# Patient Record
Sex: Female | Born: 1985 | Race: White | Hispanic: No | Marital: Married | Smoking: Never smoker
Health system: Southern US, Community
[De-identification: ages and names within clinical notes are randomized; demographics above are authoritative.]

## PROBLEM LIST (undated history)

## (undated) DIAGNOSIS — F419 Anxiety disorder, unspecified: Secondary | ICD-10-CM

## (undated) DIAGNOSIS — E7800 Pure hypercholesterolemia, unspecified: Secondary | ICD-10-CM

## (undated) DIAGNOSIS — R011 Cardiac murmur, unspecified: Secondary | ICD-10-CM

## (undated) DIAGNOSIS — E559 Vitamin D deficiency, unspecified: Secondary | ICD-10-CM

## (undated) DIAGNOSIS — G43909 Migraine, unspecified, not intractable, without status migrainosus: Secondary | ICD-10-CM

## (undated) DIAGNOSIS — N809 Endometriosis, unspecified: Secondary | ICD-10-CM

## (undated) DIAGNOSIS — E282 Polycystic ovarian syndrome: Secondary | ICD-10-CM

## (undated) DIAGNOSIS — R002 Palpitations: Secondary | ICD-10-CM

## (undated) DIAGNOSIS — I341 Nonrheumatic mitral (valve) prolapse: Secondary | ICD-10-CM

## (undated) DIAGNOSIS — Z8742 Personal history of other diseases of the female genital tract: Secondary | ICD-10-CM

## (undated) DIAGNOSIS — U071 COVID-19: Secondary | ICD-10-CM

## (undated) DIAGNOSIS — F50811 Binge eating disorder, moderate: Secondary | ICD-10-CM

## (undated) DIAGNOSIS — Z6833 Body mass index (BMI) 33.0-33.9, adult: Secondary | ICD-10-CM

## (undated) HISTORY — DX: COVID-19: U07.1

## (undated) HISTORY — PX: ANAL FISSURE REPAIR: SHX2312

## (undated) HISTORY — DX: Vitamin D deficiency, unspecified: E55.9

## (undated) HISTORY — DX: Morbid (severe) obesity due to excess calories: E66.01

## (undated) HISTORY — DX: Personal history of other diseases of the female genital tract: Z87.42

## (undated) HISTORY — DX: Body mass index (BMI) 33.0-33.9, adult: Z68.33

## (undated) HISTORY — DX: Anxiety disorder, unspecified: F41.9

## (undated) HISTORY — DX: Palpitations: R00.2

## (undated) HISTORY — DX: Pure hypercholesterolemia, unspecified: E78.00

## (undated) HISTORY — DX: Nonrheumatic mitral (valve) prolapse: I34.1

## (undated) HISTORY — DX: Cardiac murmur, unspecified: R01.1

## (undated) HISTORY — DX: Polycystic ovarian syndrome: E28.2

## (undated) HISTORY — DX: Endometriosis, unspecified: N80.9

## (undated) HISTORY — DX: Binge eating disorder, moderate: F50.811

## (undated) HISTORY — PX: ELBOW SURGERY: SHX618

## (undated) HISTORY — DX: Migraine, unspecified, not intractable, without status migrainosus: G43.909

---
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
## 2006-03-04 ENCOUNTER — Emergency Department (HOSPITAL_COMMUNITY): Admission: EM | Admit: 2006-03-04 | Discharge: 2006-03-04 | Payer: Self-pay | Admitting: Emergency Medicine

## 2006-10-30 ENCOUNTER — Ambulatory Visit: Payer: Self-pay | Admitting: Cardiology

## 2011-04-09 ENCOUNTER — Observation Stay: Payer: Self-pay | Admitting: Obstetrics and Gynecology

## 2011-07-24 ENCOUNTER — Inpatient Hospital Stay: Payer: Self-pay

## 2011-07-24 LAB — CBC WITH DIFFERENTIAL/PLATELET
Basophil #: 0.1 10*3/uL (ref 0.0–0.1)
Eosinophil %: 0.7 %
HGB: 13.6 g/dL (ref 12.0–16.0)
Lymphocyte %: 12.9 %
MCH: 32.7 pg (ref 26.0–34.0)
MCHC: 34.2 g/dL (ref 32.0–36.0)
Neutrophil #: 16.2 10*3/uL — ABNORMAL HIGH (ref 1.4–6.5)
Neutrophil %: 81.2 %
Platelet: 284 10*3/uL (ref 150–440)
RDW: 13.2 % (ref 11.5–14.5)

## 2016-09-27 DIAGNOSIS — R5383 Other fatigue: Secondary | ICD-10-CM | POA: Diagnosis not present

## 2016-09-27 DIAGNOSIS — G43919 Migraine, unspecified, intractable, without status migrainosus: Secondary | ICD-10-CM | POA: Diagnosis not present

## 2017-02-01 DIAGNOSIS — R21 Rash and other nonspecific skin eruption: Secondary | ICD-10-CM | POA: Diagnosis not present

## 2017-04-18 DIAGNOSIS — G43919 Migraine, unspecified, intractable, without status migrainosus: Secondary | ICD-10-CM | POA: Diagnosis not present

## 2017-04-18 DIAGNOSIS — R5383 Other fatigue: Secondary | ICD-10-CM | POA: Diagnosis not present

## 2017-04-25 DIAGNOSIS — Z23 Encounter for immunization: Secondary | ICD-10-CM | POA: Diagnosis not present

## 2017-08-12 DIAGNOSIS — R002 Palpitations: Secondary | ICD-10-CM | POA: Diagnosis not present

## 2017-08-12 DIAGNOSIS — G43919 Migraine, unspecified, intractable, without status migrainosus: Secondary | ICD-10-CM | POA: Diagnosis not present

## 2017-10-08 DIAGNOSIS — J358 Other chronic diseases of tonsils and adenoids: Secondary | ICD-10-CM | POA: Diagnosis not present

## 2017-10-08 DIAGNOSIS — J029 Acute pharyngitis, unspecified: Secondary | ICD-10-CM | POA: Diagnosis not present

## 2017-10-19 DIAGNOSIS — R300 Dysuria: Secondary | ICD-10-CM | POA: Diagnosis not present

## 2017-10-19 DIAGNOSIS — N390 Urinary tract infection, site not specified: Secondary | ICD-10-CM | POA: Diagnosis not present

## 2017-10-19 DIAGNOSIS — N3001 Acute cystitis with hematuria: Secondary | ICD-10-CM | POA: Diagnosis not present

## 2017-11-13 DIAGNOSIS — I341 Nonrheumatic mitral (valve) prolapse: Secondary | ICD-10-CM | POA: Diagnosis not present

## 2017-11-13 DIAGNOSIS — R5383 Other fatigue: Secondary | ICD-10-CM | POA: Diagnosis not present

## 2017-11-13 DIAGNOSIS — R002 Palpitations: Secondary | ICD-10-CM | POA: Diagnosis not present

## 2018-03-31 DIAGNOSIS — N3000 Acute cystitis without hematuria: Secondary | ICD-10-CM | POA: Diagnosis not present

## 2018-04-08 DIAGNOSIS — Z23 Encounter for immunization: Secondary | ICD-10-CM | POA: Diagnosis not present

## 2018-04-30 DIAGNOSIS — R002 Palpitations: Secondary | ICD-10-CM | POA: Diagnosis not present

## 2018-04-30 DIAGNOSIS — G43919 Migraine, unspecified, intractable, without status migrainosus: Secondary | ICD-10-CM | POA: Diagnosis not present

## 2018-08-26 DIAGNOSIS — R293 Abnormal posture: Secondary | ICD-10-CM | POA: Diagnosis not present

## 2018-08-26 DIAGNOSIS — M2560 Stiffness of unspecified joint, not elsewhere classified: Secondary | ICD-10-CM | POA: Diagnosis not present

## 2018-08-26 DIAGNOSIS — G43001 Migraine without aura, not intractable, with status migrainosus: Secondary | ICD-10-CM | POA: Diagnosis not present

## 2018-09-01 DIAGNOSIS — R293 Abnormal posture: Secondary | ICD-10-CM | POA: Diagnosis not present

## 2018-09-01 DIAGNOSIS — M2560 Stiffness of unspecified joint, not elsewhere classified: Secondary | ICD-10-CM | POA: Diagnosis not present

## 2018-09-01 DIAGNOSIS — G43001 Migraine without aura, not intractable, with status migrainosus: Secondary | ICD-10-CM | POA: Diagnosis not present

## 2018-09-15 DIAGNOSIS — M2560 Stiffness of unspecified joint, not elsewhere classified: Secondary | ICD-10-CM | POA: Diagnosis not present

## 2018-09-15 DIAGNOSIS — R293 Abnormal posture: Secondary | ICD-10-CM | POA: Diagnosis not present

## 2018-09-15 DIAGNOSIS — G43001 Migraine without aura, not intractable, with status migrainosus: Secondary | ICD-10-CM | POA: Diagnosis not present

## 2018-09-16 DIAGNOSIS — R293 Abnormal posture: Secondary | ICD-10-CM | POA: Diagnosis not present

## 2018-09-16 DIAGNOSIS — M2560 Stiffness of unspecified joint, not elsewhere classified: Secondary | ICD-10-CM | POA: Diagnosis not present

## 2018-09-16 DIAGNOSIS — G43001 Migraine without aura, not intractable, with status migrainosus: Secondary | ICD-10-CM | POA: Diagnosis not present

## 2018-09-18 DIAGNOSIS — M2560 Stiffness of unspecified joint, not elsewhere classified: Secondary | ICD-10-CM | POA: Diagnosis not present

## 2018-09-18 DIAGNOSIS — R293 Abnormal posture: Secondary | ICD-10-CM | POA: Diagnosis not present

## 2018-09-18 DIAGNOSIS — G43001 Migraine without aura, not intractable, with status migrainosus: Secondary | ICD-10-CM | POA: Diagnosis not present

## 2018-09-22 DIAGNOSIS — R293 Abnormal posture: Secondary | ICD-10-CM | POA: Diagnosis not present

## 2018-09-22 DIAGNOSIS — M2560 Stiffness of unspecified joint, not elsewhere classified: Secondary | ICD-10-CM | POA: Diagnosis not present

## 2018-09-22 DIAGNOSIS — G43001 Migraine without aura, not intractable, with status migrainosus: Secondary | ICD-10-CM | POA: Diagnosis not present

## 2018-09-23 DIAGNOSIS — M2560 Stiffness of unspecified joint, not elsewhere classified: Secondary | ICD-10-CM | POA: Diagnosis not present

## 2018-09-23 DIAGNOSIS — R293 Abnormal posture: Secondary | ICD-10-CM | POA: Diagnosis not present

## 2018-09-23 DIAGNOSIS — G43001 Migraine without aura, not intractable, with status migrainosus: Secondary | ICD-10-CM | POA: Diagnosis not present

## 2018-10-02 DIAGNOSIS — J0100 Acute maxillary sinusitis, unspecified: Secondary | ICD-10-CM | POA: Diagnosis not present

## 2018-10-21 DIAGNOSIS — R002 Palpitations: Secondary | ICD-10-CM | POA: Diagnosis not present

## 2018-10-21 DIAGNOSIS — G43919 Migraine, unspecified, intractable, without status migrainosus: Secondary | ICD-10-CM | POA: Diagnosis not present

## 2018-10-21 DIAGNOSIS — I341 Nonrheumatic mitral (valve) prolapse: Secondary | ICD-10-CM | POA: Diagnosis not present

## 2020-01-30 ENCOUNTER — Other Ambulatory Visit: Payer: Self-pay

## 2020-01-30 ENCOUNTER — Encounter: Payer: Self-pay | Admitting: Podiatry

## 2020-01-30 ENCOUNTER — Ambulatory Visit: Payer: 59 | Admitting: Podiatry

## 2020-01-30 ENCOUNTER — Telehealth: Payer: Self-pay | Admitting: Sports Medicine

## 2020-01-30 ENCOUNTER — Ambulatory Visit (INDEPENDENT_AMBULATORY_CARE_PROVIDER_SITE_OTHER): Payer: 59

## 2020-01-30 DIAGNOSIS — M722 Plantar fascial fibromatosis: Secondary | ICD-10-CM | POA: Diagnosis not present

## 2020-01-30 DIAGNOSIS — G43909 Migraine, unspecified, not intractable, without status migrainosus: Secondary | ICD-10-CM | POA: Insufficient documentation

## 2020-01-30 MED ORDER — METHYLPREDNISOLONE 4 MG PO TBPK: ORAL_TABLET | ORAL | 0 refills | Status: DC

## 2020-01-30 MED ORDER — MELOXICAM 15 MG PO TABS: 15.0000 mg | ORAL_TABLET | Freq: Every day | ORAL | 3 refills | Status: DC

## 2020-01-30 NOTE — Patient Instructions (Signed)

## 2020-01-30 NOTE — Telephone Encounter (Addendum)
Patient called reporting that she was seen today in office by Dr. Al Corpus and was given a steroid shot for plantar fasciitis.  Patient reports that her heel extending to her arch is numb and is wondering if this is normal.  Advised patient that after steroid shot that it is normal sometimes to have numbness due to the anesthetic that is mixed with the steroid medication advised patient to rest ice elevate and to take medications as prescribed for plantar fasciitis and I advised patient that if symptoms worsen -Dr Marylene Land

## 2020-01-31 NOTE — Progress Notes (Signed)
  Subjective:  Patient ID: Sabrina Gray, female    DOB: 1985/10/09,  MRN: [id number] HPI Chief Complaint  Patient presents with  . Foot Pain    Plantar heel bilateral (L>R) - aching x few months, AM pain, no treatment  . New Patient (Initial Visit)    34 y.o. female presents with the above complaint.   ROS: Denies fever chills nausea vomiting muscle aches pains calf pain back pain chest pain shortness of breath.  No past medical history on file.   Current Outpatient Medications:  .  escitalopram (LEXAPRO) 10 MG tablet, Take 10 mg by mouth at bedtime., Disp: , Rfl:  .  meloxicam (MOBIC) 15 MG tablet, Take 1 tablet (15 mg total) by mouth daily., Disp: 30 tablet, Rfl: 3 .  methylPREDNISolone (MEDROL DOSEPAK) 4 MG TBPK tablet, 6 day dose pack - take as directed, Disp: 21 tablet, Rfl: 0 .  metoprolol succinate (TOPROL-XL) 50 MG 24 hr tablet, Take 50 mg by mouth daily., Disp: , Rfl:   No Known Allergies Review of Systems Objective:  There were no vitals filed for this visit.  General: Well developed, nourished, in no acute distress, alert and oriented x3   Dermatological: Skin is warm, dry and supple bilateral. Nails x 10 are well maintained; remaining integument appears unremarkable at this time. There are no open sores, no preulcerative lesions, no rash or signs of infection present.  Vascular: Dorsalis Pedis artery and Posterior Tibial artery pedal pulses are 2/4 bilateral with immedate capillary fill time. Pedal hair growth present. No varicosities and no lower extremity edema present bilateral.   Neruologic: Grossly intact via light touch bilateral. Vibratory intact via tuning fork bilateral. Protective threshold with Semmes Wienstein monofilament intact to all pedal sites bilateral. Patellar and Achilles deep tendon reflexes 2+ bilateral. No Babinski or clonus noted bilateral.   Musculoskeletal: No gross boney pedal deformities bilateral. No pain, crepitus, or limitation noted  with foot and ankle range of motion bilateral. Muscular strength 5/5 in all groups tested bilateral.  Pain on palpation medial calcaneal tubercles bilateral.  No pain on medial and lateral compression of the calcaneus.  No pain on palpation of the fourth fifth TMT joint.  Gait: Unassisted, Nonantalgic.    Radiographs:  Radiographs taken today demonstrate an osseously mature individual soft tissue increase in density plantar fascial kidney insertion site.  No significant osteoarthritic changes no acute findings.  Soft tissue margins appear to be relatively normal with exception of the plantar fascia.  Assessment & Plan:   Assessment: Plantar fasciitis bilateral.  Plan: Discussed etiology pathology conservative versus surgical therapies.  At this point injected the bilateral heels 20 mg Kenalog 5 mg Marcaine point of maximal tenderness bilateral.  Started on her on methylprednisolone to be followed by meloxicam.  Placed her in a plantar fascial brace bilaterally and a single night splint.  I will follow-up with her in 1 month as she will receive stretching exercises as well.     Knut Rondinelli T. [place], [place]

## 2020-03-01 ENCOUNTER — Encounter: Payer: Self-pay | Admitting: Podiatry

## 2020-03-01 ENCOUNTER — Other Ambulatory Visit: Payer: Self-pay

## 2020-03-01 ENCOUNTER — Ambulatory Visit: Payer: 59 | Admitting: Podiatry

## 2020-03-01 DIAGNOSIS — M722 Plantar fascial fibromatosis: Secondary | ICD-10-CM

## 2020-03-02 NOTE — Progress Notes (Signed)
She presents today for follow-up of her bilateral plantar fasciitis states that the right foot is 100% better the left foot is about 50% better and the left one is the one that was causing pain initially.  Objective: Vital signs are stable alert and oriented x3.  Pulses are palpable.  There is no erythema edema cellulitis drainage or odor though she does have pain with palpation mid calcaneal tubercle of the left heel.  Assessment: Plantar fasciitis left.  Plan: I reinjected left heel today we will follow-up with her in 1 month.

## 2020-03-29 ENCOUNTER — Ambulatory Visit: Payer: 59 | Admitting: Podiatry

## 2020-04-12 ENCOUNTER — Telehealth: Payer: Self-pay

## 2020-04-12 NOTE — Telephone Encounter (Signed)
Pt was seen by Dr. Al Corpus. Pt states that her right 5th toe has been numb. Pt had broke her toe about a month in hal ago. Pt would like to know if this is normal.

## 2020-04-13 NOTE — Telephone Encounter (Signed)
Yes it is normal and may continue for as long as it is swollen but depending on the injury it may never feel completely normal.

## 2020-04-14 NOTE — Telephone Encounter (Signed)
Called patient-  Informed patient of Dr. Geryl Rankins response

## 2020-07-05 ENCOUNTER — Encounter: Payer: Self-pay | Admitting: *Deleted

## 2020-07-05 NOTE — Progress Notes (Signed)
[facility]    Provider:  Dr Lucia Gaskins Requesting Provider: Jarrett Soho, PA-C Primary Care Provider:  Jarrett Soho, PA-C  CC:  Migraine   HPI:  Sabrina Gray is a 34 y.o. female here as requested by Jarrett Soho, PA-C for migrianes. PMHx anxiety, morbid obesity, hypertension, migraine without aura.  I reviewed Dr. Jeannette How notes, she is on metoprolol, Fioricet and Lexapro, she has frequent headaches more than 3/week, has seen headache specialist and Lewayne Bunting does not want to go back, she was placed on metoprolol for prevention of migraines, she is also had Botox injections before, she still has migraines 1-2 a month and tension headaches about once a week usually stress related, she is taking Advil or Tylenol about 4 to 5 days/week.  Patient is here alone and she reports migraine started in college, they have gotten more frequent, Fioricet does not really help, she is also tried ibuprofen, Tylenol, Excedrin Migraine, Excedrin extra strength, Aleve and none help.Both mother and aunt are seen her. Starts right periorbital, horrible and miserable and they hurt. As they progress they can spread. Pulsating/pouding/throbbing/photo/phonophobia, nausea, occ dizziness, movement makes them worse, last 48-72 ours and be severe. 26/30 headache days and out of those, she will wake up with headaches, 8-10 migraine days a month that can be moderatelty-severe to severe. Unknown triggers. Her menses came make them worse or trigger them. Sleeping/dark room may help. OTC meds do no thelp she doesn't take them. She has never tried a triptan. She tried fioricet. She has mitral valve prolapse, fioricet hasn;t helped. Worse positionally supine in the morning when she wakes up with them. No other focal neurologic deficits, associated symptoms, inciting events or modifiable factors.  Reviewed notes, labs and imaging from outside physicians, which showed:  Medications tried that can be  used in migraine management: lexapro, metoprolol, fioricet, excedrin, ibu,tylenol, mobic, medrol dosepak, nerve blocks  Review of Systems: Patient complains of symptoms per HPI as well as the following symptoms:headache. Pertinent negatives and positives per HPI. All others negative.   Social History   Socioeconomic History  . Marital status: Married    Spouse name: Not on file  . Number of children: 1  . Years of education: Not on file  . Highest education level: Bachelor's degree (e.g., BA, AB, BS)  Occupational History  . Not on file  Tobacco Use  . Smoking status: Never Smoker  . Smokeless tobacco: Never Used  Vaping Use  . Vaping Use: Never used  Substance and Sexual Activity  . Alcohol use: Not Currently  . Drug use: Never  . Sexual activity: Not on file  Other Topics Concern  . Not on file  Social History Narrative   Lives at home with husband and daughter   Right handed   Caffeine: 2 cups/day   Social Determinants of Health   Financial Resource Strain: Not on file  Food Insecurity: Not on file  Transportation Needs: Not on file  Physical Activity: Not on file  Stress: Not on file  Social Connections: Not on file  Intimate Partner Violence: Not on file    Family History  Problem Relation Age of Onset  . Hypertension Father   . Prostate cancer Paternal Grandfather   . Heart disease Cousin   . Leukemia Maternal Aunt   . Migraines Mother   . Migraines Maternal Aunt     Past Medical History:  Diagnosis Date  . Anxiety   . Migraine     Patient Active Problem  List   Diagnosis Date Noted  . Chronic migraine without aura without status migrainosus, not intractable 07/06/2020  . Migraine 01/30/2020    Past Surgical History:  Procedure Laterality Date  . ELBOW SURGERY     reconstructive nerve surgery at 7 years ago.    Current Outpatient Medications  Medication Sig Dispense Refill  . escitalopram (LEXAPRO) 10 MG tablet Take 10 mg by mouth at  bedtime.    . Fremanezumab-vfrm (AJOVY) 225 MG/1.5ML SOAJ Inject 225 mg into the skin every 30 (thirty) days. 4.5 mL 3  . metoprolol succinate (TOPROL-XL) 50 MG 24 hr tablet Take 50 mg by mouth daily.    . rizatriptan (MAXALT-MLT) 10 MG disintegrating tablet Take 1 tablet (10 mg total) by mouth as needed for migraine. May repeat in 2 hours if needed 9 tablet 11   No current facility-administered medications for this visit.    Allergies as of 07/06/2020  . (No Known Allergies)    Vitals: BP 131/84 (BP Location: Right Arm, Patient Position: Sitting)   Pulse 71   Ht 5\' 7"  (1.702 m)   Wt 264 lb 12.8 oz (120.1 kg)   BMI 41.47 kg/m  Last Weight:  Wt Readings from Last 1 Encounters:  07/06/20 264 lb 12.8 oz (120.1 kg)   Last Height:   Ht Readings from Last 1 Encounters:  07/06/20 5\' 7"  (1.702 m)     Physical exam: Exam: Gen: NAD, conversant, well nourised, obese, well groomed                     CV: RRR, no MRG. No Carotid Bruits. No peripheral edema, warm, nontender Eyes: Conjunctivae clear without exudates or hemorrhage  Neuro: Detailed Neurologic Exam  Speech:    Speech is normal; fluent and spontaneous with normal comprehension.  Cognition:    The patient is oriented to person, place, and time;     recent and remote memory intact;     language fluent;     normal attention, concentration,     fund of knowledge Cranial Nerves:    The pupils are equal, round, and reactive to light. The fundi are normal and spontaneous venous pulsations are present. Visual fields are full to finger confrontation. Extraocular movements are intact. Trigeminal sensation is intact and the muscles of mastication are normal. The face is symmetric. The palate elevates in the midline. Hearing intact. Voice is normal. Shoulder shrug is normal. The tongue has normal motion without fasciculations.   Coordination:    Normal finger to nose   Gait:    Normal native gait  Motor Observation:    No  asymmetry, no atrophy, and no involuntary movements noted. Tone:    Normal muscle tone.    Posture:    Posture is normal. normal erect    Strength:    Strength is V/V in the upper and lower limbs.      Sensation: intact to LT     Reflex Exam:  DTR's:    Deep tendon reflexes in the upper and lower extremities are normal bilaterally.   Toes:    The toes are downgoing bilaterally.   Clonus:    Clonus is absent.    Assessment/Plan:  34 year old with chronic migraines failed multipe medication classes. We discussed her options and she would like Ajovy. Discussed teratogenicity and need to stop 6 months prior to any pregnancy. I also recommended an MRI brain due to her positional and morning headaches with no prior  brain imaging.  Prevention: Ajovy Acute: Maxalt  MRI brain due to concerning symptoms of morning headaches, positional headaches,worening headache to look for space occupying mass, chiari or intracranial hypertension (pseudotumor).   Orders Placed This Encounter  Procedures  . MR BRAIN W WO CONTRAST  . CBC with Differential/Platelets  . Comprehensive metabolic panel  . TSH   Meds ordered this encounter  Medications  . rizatriptan (MAXALT-MLT) 10 MG disintegrating tablet    Sig: Take 1 tablet (10 mg total) by mouth as needed for migraine. May repeat in 2 hours if needed    Dispense:  9 tablet    Refill:  11  . Fremanezumab-vfrm (AJOVY) 225 MG/1.5ML SOAJ    Sig: Inject 225 mg into the skin every 30 (thirty) days.    Dispense:  4.5 mL    Refill:  3    Dispense a 21-month supply. Patient has copay card; she can have medication for $5 regardless of insurance approval or copay amount.    Discussed: To prevent or relieve headaches, try the following: Cool Compress. Lie down and place a cool compress on your head.  Avoid headache triggers. If certain foods or odors seem to have triggered your migraines in the past, avoid them. A headache diary might help you identify  triggers.  Include physical activity in your daily routine. Try a daily walk or other moderate aerobic exercise.  Manage stress. Find healthy ways to cope with the stressors, such as delegating tasks on your to-do list.  Practice relaxation techniques. Try deep breathing, yoga, massage and visualization.  Eat regularly. Eating regularly scheduled meals and maintaining a healthy diet might help prevent headaches. Also, drink plenty of fluids.  Follow a regular sleep schedule. Sleep deprivation might contribute to headaches Consider biofeedback. With this mind-body technique, you learn to control certain bodily functions -- such as muscle tension, heart rate and blood pressure -- to prevent headaches or reduce headache pain.    Proceed to emergency room if you experience new or worsening symptoms or symptoms do not resolve, if you have new neurologic symptoms or if headache is severe, or for any concerning symptom.   Provided education and documentation from American headache Society toolbox including articles on: chronic migraine medication overuse headache, chronic migraines, prevention of migraines, behavioral and other nonpharmacologic treatments for headache.   Cc: Jarrett Soho, PA-C,  Jarrett Soho, PA-C  Naomie Dean, MD  [facility] [address] [place], [place] [address]  Phone [phone] Fax [phone]

## 2020-07-06 ENCOUNTER — Telehealth: Payer: Self-pay | Admitting: Neurology

## 2020-07-06 ENCOUNTER — Ambulatory Visit: Payer: 59 | Admitting: Neurology

## 2020-07-06 ENCOUNTER — Other Ambulatory Visit: Payer: Self-pay | Admitting: Neurology

## 2020-07-06 ENCOUNTER — Encounter: Payer: Self-pay | Admitting: Neurology

## 2020-07-06 VITALS — BP 131/84 | HR 71 | Ht 67.0 in | Wt 264.8 lb

## 2020-07-06 DIAGNOSIS — G43709 Chronic migraine without aura, not intractable, without status migrainosus: Secondary | ICD-10-CM

## 2020-07-06 DIAGNOSIS — R519 Headache, unspecified: Secondary | ICD-10-CM

## 2020-07-06 DIAGNOSIS — R51 Headache with orthostatic component, not elsewhere classified: Secondary | ICD-10-CM

## 2020-07-06 MED ORDER — RIZATRIPTAN BENZOATE 10 MG PO TBDP: 10.0000 mg | ORAL_TABLET | ORAL | 11 refills | Status: DC | PRN

## 2020-07-06 MED ORDER — AJOVY 225 MG/1.5ML ~~LOC~~ SOAJ: 225.0000 mg | SUBCUTANEOUS | 3 refills | Status: DC

## 2020-07-06 NOTE — Patient Instructions (Signed)
Prevention: Ajovy Avute:Rizatriptan: Please take one tablet at the onset of your headache. If it does not improve the symptoms please take one additional tablet. Do not take more then 2 tablets in 24hrs. Do not take use more then 2 to 3 times in a week.  Rizatriptan disintegrating tablets What is this medicine? RIZATRIPTAN (rye za TRIP tan) is used to treat migraines with or without aura. An aura is a strange feeling or visual disturbance that warns you of an attack. It is not used to prevent migraines. This medicine may be used for other purposes; ask your health care provider or pharmacist if you have questions. COMMON BRAND NAME(S): Maxalt-MLT What should I tell my health care provider before I take this medicine? They need to know if you have any of these conditions:  cigarette smoker  circulation problems in fingers and toes  diabetes  heart disease  high blood pressure  high cholesterol  history of irregular heartbeat  history of stroke  kidney disease  liver disease  stomach or intestine problems  an unusual or allergic reaction to rizatriptan, other medicines, foods, dyes, or preservatives  pregnant or trying to get pregnant  breast-feeding How should I use this medicine? Take this medicine by mouth. Follow the directions on the prescription label. Leave the tablet in the sealed blister pack until you are ready to take it. With dry hands, open the blister and gently remove the tablet. If the tablet breaks or crumbles, throw it away and take a new tablet out of the blister pack. Place the tablet in the mouth and allow it to dissolve, and then swallow. Do not cut, crush, or chew this medicine. You do not need water to take this medicine. Do not take it more often than directed. Talk to your pediatrician regarding the use of this medicine in children. While this drug may be prescribed for children as young as 6 years for selected conditions, precautions do  apply. Overdosage: If you think you have taken too much of this medicine contact a poison control center or emergency room at once. NOTE: This medicine is only for you. Do not share this medicine with others. What if I miss a dose? This does not apply. This medicine is not for regular use. What may interact with this medicine? Do not take this medicine with any of the following medicines:  certain medicines for migraine headache like almotriptan, eletriptan, frovatriptan, naratriptan, rizatriptan, sumatriptan, zolmitriptan  ergot alkaloids like dihydroergotamine, ergonovine, ergotamine, methylergonovine  MAOIs like Carbex, Eldepryl, Marplan, Nardil, and Parnate This medicine may also interact with the following medications:  certain medicines for depression, anxiety, or psychotic disorders  propranolol This list may not describe all possible interactions. Give your health care provider a list of all the medicines, herbs, non-prescription drugs, or dietary supplements you use. Also tell them if you smoke, drink alcohol, or use illegal drugs. Some items may interact with your medicine. What should I watch for while using this medicine? Visit your healthcare professional for regular checks on your progress. Tell your healthcare professional if your symptoms do not start to get better or if they get worse. You may get drowsy or dizzy. Do not drive, use machinery, or do anything that needs mental alertness until you know how this medicine affects you. Do not stand up or sit up quickly, especially if you are an older patient. This reduces the risk of dizzy or fainting spells. Alcohol may interfere with the effect of this medicine.  Your mouth may get dry. Chewing sugarless gum or sucking hard candy and drinking plenty of water may help. Contact your healthcare professional if the problem does not go away or is severe. If you take migraine medicines for 10 or more days a month, your migraines may get  worse. Keep a diary of headache days and medicine use. Contact your healthcare professional if your migraine attacks occur more frequently. What side effects may I notice from receiving this medicine? Side effects that you should report to your doctor or health care professional as soon as possible:  allergic reactions like skin rash, itching or hives, swelling of the face, lips, or tongue  chest pain or chest tightness  signs and symptoms of a dangerous change in heartbeat or heart rhythm like chest pain; dizziness; fast, irregular heartbeat; palpitations; feeling faint or lightheaded; falls; breathing problems  signs and symptoms of a stroke like changes in vision; confusion; trouble speaking or understanding; severe headaches; sudden numbness or weakness of the face, arm or leg; trouble walking; dizziness; loss of balance or coordination  signs and symptoms of serotonin syndrome like irritable; confusion; diarrhea; fast or irregular heartbeat; muscle twitching; stiff muscles; trouble walking; sweating; high fever; seizures; chills; vomiting Side effects that usually do not require medical attention (report to your doctor or health care professional if they continue or are bothersome):  diarrhea  dizziness  drowsiness  dry mouth  headache  nausea, vomiting  pain, tingling, numbness in the hands or feet  stomach pain This list may not describe all possible side effects. Call your doctor for medical advice about side effects. You may report side effects to FDA at 1-800-FDA-1088. Where should I keep my medicine? Keep out of the reach of children. Store at room temperature between 15 and 30 degrees C (59 and 86 degrees F). Protect from light and moisture. Throw away any unused medicine after the expiration date. NOTE: This sheet is a summary. It may not cover all possible information. If you have questions about this medicine, talk to your doctor, pharmacist, or health care  provider.  2020 Elsevier/Gold Standard (2018-01-14 14:58:08) Vernell Barrier injection What is this medicine? FREMANEZUMAB (fre ma NEZ ue mab) is used to prevent migraine headaches. This medicine may be used for other purposes; ask your health care provider or pharmacist if you have questions. COMMON BRAND NAME(S): AJOVY What should I tell my health care provider before I take this medicine? They need to know if you have any of these conditions:  an unusual or allergic reaction to fremanezumab, other medicines, foods, dyes, or preservatives  pregnant or trying to get pregnant  breast-feeding How should I use this medicine? This medicine is for injection under the skin. You will be taught how to prepare and give this medicine. Use exactly as directed. Take your medicine at regular intervals. Do not take your medicine more often than directed. It is important that you put your used needles and syringes in a special sharps container. Do not put them in a trash can. If you do not have a sharps container, call your pharmacist or healthcare provider to get one. Talk to your pediatrician regarding the use of this medicine in children. Special care may be needed. Overdosage: If you think you have taken too much of this medicine contact a poison control center or emergency room at once. NOTE: This medicine is only for you. Do not share this medicine with others. What if I miss a dose? If you miss  a dose, take it as soon as you can. If it is almost time for your next dose, take only that dose. Do not take double or extra doses. What may interact with this medicine? Interactions are not expected. This list may not describe all possible interactions. Give your health care provider a list of all the medicines, herbs, non-prescription drugs, or dietary supplements you use. Also tell them if you smoke, drink alcohol, or use illegal drugs. Some items may interact with your medicine. What should I watch for  while using this medicine? Tell your doctor or healthcare professional if your symptoms do not start to get better or if they get worse. What side effects may I notice from receiving this medicine? Side effects that you should report to your doctor or health care professional as soon as possible:  allergic reactions like skin rash, itching or hives, swelling of the face, lips, or tongue Side effects that usually do not require medical attention (report these to your doctor or health care professional if they continue or are bothersome):  pain, redness, or irritation at site where injected This list may not describe all possible side effects. Call your doctor for medical advice about side effects. You may report side effects to FDA at 1-800-FDA-1088. Where should I keep my medicine? Keep out of the reach of children. You will be instructed on how to store this medicine. Throw away any unused medicine after the expiration date on the label. NOTE: This sheet is a summary. It may not cover all possible information. If you have questions about this medicine, talk to your doctor, pharmacist, or health care provider.  2020 Elsevier/Gold Standard (2017-04-01 17:22:56)

## 2020-07-06 NOTE — Telephone Encounter (Signed)
Patient returned my call she is scheduled at [facility] for 07/12/20.

## 2020-07-06 NOTE — Telephone Encounter (Signed)
LVM for pt to call back about scheduling mri  [facility] auth: [id number] (exp. 07/06/20 to 08/20/20)

## 2020-07-07 LAB — COMPREHENSIVE METABOLIC PANEL
ALT: 23 IU/L (ref 0–32)
AST: 18 IU/L (ref 0–40)
Albumin/Globulin Ratio: 1.7 (ref 1.2–2.2)
Albumin: 4.7 g/dL (ref 3.8–4.8)
Alkaline Phosphatase: 140 IU/L — ABNORMAL HIGH (ref 44–121)
BUN/Creatinine Ratio: 21 (ref 9–23)
BUN: 18 mg/dL (ref 6–20)
Bilirubin Total: 0.3 mg/dL (ref 0.0–1.2)
CO2: 23 mmol/L (ref 20–29)
Calcium: 10 mg/dL (ref 8.7–10.2)
Chloride: 102 mmol/L (ref 96–106)
Creatinine, Ser: 0.84 mg/dL (ref 0.57–1.00)
GFR calc Af Amer: 105 mL/min/{1.73_m2} (ref >59)
GFR calc non Af Amer: 91 mL/min/{1.73_m2} (ref >59)
Globulin, Total: 2.7 g/dL (ref 1.5–4.5)
Glucose: 83 mg/dL (ref 65–99)
Potassium: 4.6 mmol/L (ref 3.5–5.2)
Sodium: 139 mmol/L (ref 134–144)
Total Protein: 7.4 g/dL (ref 6.0–8.5)

## 2020-07-07 LAB — CBC WITH DIFFERENTIAL/PLATELET
Basophils Absolute: 0.1 10*3/uL (ref 0.0–0.2)
Basos: 1 %
EOS (ABSOLUTE): 0.5 10*3/uL — ABNORMAL HIGH (ref 0.0–0.4)
Eos: 4 %
Hematocrit: 41.9 % (ref 34.0–46.6)
Hemoglobin: 14.5 g/dL (ref 11.1–15.9)
Immature Grans (Abs): 0.1 10*3/uL (ref 0.0–0.1)
Immature Granulocytes: 1 %
Lymphocytes Absolute: 3.2 10*3/uL — ABNORMAL HIGH (ref 0.7–3.1)
Lymphs: 26 %
MCH: 32.6 pg (ref 26.6–33.0)
MCHC: 34.6 g/dL (ref 31.5–35.7)
MCV: 94 fL (ref 79–97)
Monocytes Absolute: 0.7 10*3/uL (ref 0.1–0.9)
Monocytes: 6 %
Neutrophils Absolute: 7.6 10*3/uL — ABNORMAL HIGH (ref 1.4–7.0)
Neutrophils: 62 %
Platelets: 381 10*3/uL (ref 150–450)
RBC: 4.45 x10E6/uL (ref 3.77–5.28)
RDW: 11.8 % (ref 11.7–15.4)
WBC: 12.1 10*3/uL — ABNORMAL HIGH (ref 3.4–10.8)

## 2020-07-07 LAB — TSH: TSH: 1.61 u[IU]/mL (ref 0.450–4.500)

## 2020-07-12 ENCOUNTER — Ambulatory Visit: Payer: 59

## 2020-07-12 ENCOUNTER — Other Ambulatory Visit: Payer: Self-pay

## 2020-07-12 DIAGNOSIS — R519 Headache, unspecified: Secondary | ICD-10-CM

## 2020-07-12 DIAGNOSIS — R51 Headache with orthostatic component, not elsewhere classified: Secondary | ICD-10-CM

## 2020-07-12 MED ORDER — GADOBUTROL 1 MMOL/ML IV SOLN
10.0000 mL | Freq: Once | INTRAVENOUS | Status: AC | PRN
  Administered 2020-07-12: 10 mL via INTRAVENOUS

## 2020-10-06 MED ORDER — AJOVY 225 MG/1.5ML ~~LOC~~ SOAJ: 225.0000 mg | SUBCUTANEOUS | 0 refills | Status: DC

## 2020-10-06 NOTE — Telephone Encounter (Signed)
Patient came by office (confirmed name & DOB) and was provided 1 Ajovy 225 mg sample along w/ copy of med list. Order for sample in chart.

## 2020-11-21 ENCOUNTER — Ambulatory Visit: Payer: 59 | Admitting: Adult Health

## 2021-03-07 ENCOUNTER — Other Ambulatory Visit: Payer: Self-pay | Admitting: Family Medicine

## 2021-03-09 ENCOUNTER — Other Ambulatory Visit: Payer: Self-pay | Admitting: Family Medicine

## 2021-03-09 DIAGNOSIS — R102 Pelvic and perineal pain: Secondary | ICD-10-CM

## 2021-03-10 ENCOUNTER — Ambulatory Visit
Admission: RE | Admit: 2021-03-10 | Discharge: 2021-03-10 | Disposition: A | Discharge status: Home / Self Care | Payer: 59 | Source: Ambulatory Visit | Attending: Family Medicine | Admitting: Family Medicine

## 2021-03-10 DIAGNOSIS — R102 Pelvic and perineal pain: Secondary | ICD-10-CM

## 2021-03-13 MED ORDER — EMGALITY 120 MG/ML ~~LOC~~ SOAJ: 120.0000 mg | SUBCUTANEOUS | 5 refills | Status: DC

## 2021-03-14 NOTE — Addendum Note (Signed)
Addended by: Bertram Savin on: 03/14/2021 01:27 PM   Modules accepted: Orders

## 2021-03-14 NOTE — Telephone Encounter (Signed)
Spoke with [facility].  They do have the prescription however they had to order the medication.  Hopefully will be here today.  I let the patient know.

## 2021-06-14 ENCOUNTER — Ambulatory Visit: Payer: 59 | Admitting: Adult Health

## 2021-06-14 ENCOUNTER — Encounter: Payer: Self-pay | Admitting: Adult Health

## 2021-06-14 DIAGNOSIS — G43709 Chronic migraine without aura, not intractable, without status migrainosus: Secondary | ICD-10-CM | POA: Diagnosis not present

## 2021-06-14 MED ORDER — NURTEC 75 MG PO TBDP: ORAL_TABLET | ORAL | 5 refills | Status: DC

## 2021-06-14 MED ORDER — AJOVY 225 MG/1.5ML ~~LOC~~ SOAJ: 225.0000 mg | SUBCUTANEOUS | 3 refills | Status: DC

## 2021-06-14 NOTE — Telephone Encounter (Signed)
PA in progress on CMM.

## 2021-06-14 NOTE — Telephone Encounter (Signed)
Completed PA on Cover My Meds. Key: [id number]. Awaiting determination from [facility] within 72 hours.

## 2021-06-14 NOTE — Patient Instructions (Signed)
Your Plan:  Will try to get Ajovy approved. Hold emgality for now. Try Nurtec for abortive therapy when you get a migraine. Continue Maxalt if needed. Do not take Maxalt and Nurtec together If your symptoms worsen or you develop new symptoms please let us know.     Thank you for coming to see [place] at [facility]. I hope we have been able to provide you high quality care today.  You may receive a patient satisfaction survey over the next few weeks. We would appreciate your feedback and comments so that we may continue to improve ourselves and the health of our patients.

## 2021-06-14 NOTE — Progress Notes (Signed)
PATIENT: Sabrina Gray DOB: 15-Jun-1986  REASON FOR VISIT: follow up HISTORY FROM: patient   HISTORY OF PRESENT ILLNESS: Today 06/14/21:  Sabrina Gray is a 35 year old female with a history of migraine headaches.  She returns today for follow-up.  The patient states that Emgality has not been beneficial for her.  She is having 4-5 migraines a month and a mild headache at least 5 days a week.  She typically takes ibuprofen or Tylenol as Maxalt makes her extremely sleepy and she is unable to take it at work.  She states that when she was on Ajovy her migraines reduced significantly.  She is unable to try Aimovig due to cardiac risk factors.  The patient has a diagnosis of MVP and cardiac rhythm irregularities per the patient.  HISTORY (copied from Dr. Trevor Mace note) Sabrina Gray is a 35 y.o. female here as requested by Jarrett Soho, PA-C for migrianes. PMHx anxiety, morbid obesity, hypertension, migraine without aura.  I reviewed Dr. Jeannette How notes, she is on metoprolol, Fioricet and Lexapro, she has frequent headaches more than 3/week, has seen headache specialist and Lewayne Bunting does not want to go back, she was placed on metoprolol for prevention of migraines, she is also had Botox injections before, she still has migraines 1-2 a month and tension headaches about once a week usually stress related, she is taking Advil or Tylenol about 4 to 5 days/week.  Patient is here alone and she reports migraine started in college, they have gotten more frequent, Fioricet does not really help, she is also tried ibuprofen, Tylenol, Excedrin Migraine, Excedrin extra strength, Aleve and none help.Both mother and aunt are seen her. Starts right periorbital, horrible and miserable and they hurt. As they progress they can spread. Pulsating/pouding/throbbing/photo/phonophobia, nausea, occ dizziness, movement makes them worse, last 48-72 ours and be severe. 26/30 headache days and out of those,  she will wake up with headaches, 8-10 migraine days a month that can be moderatelty-severe to severe. Unknown triggers. Her menses came make them worse or trigger them. Sleeping/dark room may help. OTC meds do no thelp she doesn't take them. She has never tried a triptan. She tried fioricet. She has mitral valve prolapse, fioricet hasn;t helped. Worse positionally supine in the morning when she wakes up with them. No other focal neurologic deficits, associated symptoms, inciting events or modifiable factors.  Reviewed notes, labs and imaging from outside physicians, which showed:  Medications tried that can be used in migraine management: lexapro, metoprolol, fioricet, excedrin, ibu,tylenol, mobic, medrol dosepak, nerve blocks  REVIEW OF SYSTEMS: Out of a complete 14 system review of symptoms, the patient complains only of the following symptoms, and all other reviewed systems are negative.  ALLERGIES: No Known Allergies  HOME MEDICATIONS: Outpatient Medications Prior to Visit  Medication Sig Dispense Refill   escitalopram (LEXAPRO) 10 MG tablet Take 10 mg by mouth at bedtime.     Galcanezumab-gnlm (EMGALITY) 120 MG/ML SOAJ Inject 120 mg into the skin every 30 (thirty) days. 1 mL 5   metoprolol succinate (TOPROL-XL) 50 MG 24 hr tablet Take 50 mg by mouth daily.     rizatriptan (MAXALT-MLT) 10 MG disintegrating tablet Take 1 tablet (10 mg total) by mouth as needed for migraine. May repeat in 2 hours if needed 9 tablet 11   AJOVY 225 MG/1.5ML SOAJ INJECT 225 MG INTO THE SKIN EVERY 30 (THIRTY) DAYS. 4.5 mL 3   No facility-administered medications prior to visit.    PAST MEDICAL HISTORY:  Past Medical History:  Diagnosis Date   Anxiety    COVID    Migraine     PAST SURGICAL HISTORY: Past Surgical History:  Procedure Laterality Date   ELBOW SURGERY     reconstructive nerve surgery at 7 years ago.    FAMILY HISTORY: Family History  Problem Relation Age of Onset   Hypertension Father     Prostate cancer Paternal Grandfather    Heart disease Cousin    Leukemia Maternal Aunt    Migraines Mother    Migraines Maternal Aunt     SOCIAL HISTORY: Social History   Socioeconomic History   Marital status: Married    Spouse name: Not on file   Number of children: 1   Years of education: Not on file   Highest education level: Bachelor's degree (e.g., BA, AB, BS)  Occupational History   Not on file  Tobacco Use   Smoking status: Never   Smokeless tobacco: Never  Vaping Use   Vaping Use: Never used  Substance and Sexual Activity   Alcohol use: Not Currently   Drug use: Never   Sexual activity: Not on file  Other Topics Concern   Not on file  Social History Narrative   Lives at home with husband and daughter   Right handed   Caffeine: 2 cups/day   Social Determinants of Health   Financial Resource Strain: Not on file  Food Insecurity: Not on file  Transportation Needs: Not on file  Physical Activity: Not on file  Stress: Not on file  Social Connections: Not on file  Intimate Partner Violence: Not on file      PHYSICAL EXAM  Vitals:   06/14/21 0952  BP: 115/82  Pulse: 78  Weight: 258 lb 12.8 oz (117.4 kg)  Height: 5\' 7"  (1.702 m)   Body mass index is 40.53 kg/m.  Generalized: Well developed, in no acute distress   Neurological examination  Mentation: Alert oriented to time, place, history taking. Follows all commands speech and language fluent Cranial nerve II-XII: Pupils were equal round reactive to light. Extraocular movements were full, visual field were full on confrontational test. Facial sensation and strength were normal. Head turning and shoulder shrug  were normal and symmetric. Motor: The motor testing reveals 5 over 5 strength of all 4 extremities. Good symmetric motor tone is noted throughout.  Sensory: Sensory testing is intact to soft touch on all 4 extremities. No evidence of extinction is noted.  Coordination: Cerebellar testing  reveals good finger-nose-finger and heel-to-shin bilaterally.  Gait and station: Gait is normal. Reflexes: Deep tendon reflexes are symmetric and normal bilaterally.   DIAGNOSTIC DATA (LABS, IMAGING, TESTING) - I reviewed patient records, labs, notes, testing and imaging myself where available.  Lab Results  Component Value Date   WBC 12.1 (H) 07/06/2020   HGB 14.5 07/06/2020   HCT 41.9 07/06/2020   MCV 94 07/06/2020   PLT 381 07/06/2020      Component Value Date/Time   NA 139 07/06/2020 0950   K 4.6 07/06/2020 0950   CL 102 07/06/2020 0950   CO2 23 07/06/2020 0950   GLUCOSE 83 07/06/2020 0950   BUN 18 07/06/2020 0950   CREATININE 0.84 07/06/2020 0950   CALCIUM 10.0 07/06/2020 0950   PROT 7.4 07/06/2020 0950   ALBUMIN 4.7 07/06/2020 0950   AST 18 07/06/2020 0950   ALT 23 07/06/2020 0950   ALKPHOS 140 (H) 07/06/2020 0950   BILITOT 0.3 07/06/2020 0950   GFRNONAA 91  07/06/2020 0950   GFRAA 105 07/06/2020 0950    Lab Results  Component Value Date   TSH 1.610 07/06/2020      ASSESSMENT AND PLAN 35 y.o. year old female  has a past medical history of Anxiety, COVID, and Migraine. here with:  1.  Migraine headache  -We will retry Ajovy and see if insurance will approve it.  The patient has tried [employer] and we are unable to try Aimovig due to cardiac risk factors. -If this is not approved then we will discuss other preventative options.  The patient has already tried Botox therapy.  May consider Qulipta -Patient will try Nurtec for abortive therapy. -Can continue Maxalt for abortive therapy.  Advised to not take Nurtec and Maxalt together. -Follow-up in 6 months or sooner if needed     Butch Penny, MSN, NP-C 06/14/2021, 10:01 AM [facility] [address], [address] [place], [place] [address] [phone]

## 2021-10-12 ENCOUNTER — Other Ambulatory Visit: Payer: Self-pay | Admitting: Neurology

## 2021-10-16 NOTE — Telephone Encounter (Signed)
Last OV said hold Emgality would try Ajovy. Ajovy was not on pt's formulary and was denied by insurance. Will check with pt as it looks like she ended up staying on Emgality.  ?

## 2021-10-18 ENCOUNTER — Telehealth: Payer: Self-pay | Admitting: *Deleted

## 2021-10-18 MED ORDER — EMGALITY 120 MG/ML ~~LOC~~ SOAJ: 120.0000 mg | SUBCUTANEOUS | 2 refills | Status: DC

## 2021-10-18 NOTE — Telephone Encounter (Signed)
Completed Ajovy PA on Cover My Meds. Key: [id number]. Awaiting determination from [employer]. ?

## 2021-10-18 NOTE — Telephone Encounter (Addendum)
Sabrina Gray has been denied. It is not a covered benefit and has been administratively denied. If appeal desired, send within 180 days Expedited/Urgent Fax: [phone]. Case number: [id number]. ?

## 2021-10-18 NOTE — Addendum Note (Signed)
Addended by: Bertram Savin on: 10/18/2021 04:46 PM ? ? Modules accepted: Orders ? ?

## 2021-10-18 NOTE — Telephone Encounter (Signed)
yes

## 2021-11-27 ENCOUNTER — Telehealth: Payer: Self-pay | Admitting: Adult Health

## 2021-11-27 NOTE — Telephone Encounter (Signed)
Muslima @ [employer] is asking for a call to discuss what cardiac issues pt has as to why she is unable to use Aimovig 1st, please call [phone] ?

## 2021-11-27 NOTE — Telephone Encounter (Signed)
LMVM for pt to return call, relayed received call from insurance about taking aimovig ? Cardiac issues MVP and arrhymia?  States that can casue HTN.  ?

## 2021-11-28 NOTE — Telephone Encounter (Signed)
Called Sabrina Gray back w/ [employer] and LVM advising patient wants to withdraw the Ajovy appeal at this time and continue with Emgality.  ?

## 2021-11-28 NOTE — Telephone Encounter (Signed)
It was my understand that the patient was on metroprolol d/t Bp issues and rate control. For this reason she didn't feel comfortable trying aimovig due to risk of hypertension. We can always verify with patient. ?

## 2021-12-14 ENCOUNTER — Ambulatory Visit: Payer: 59 | Admitting: Adult Health

## 2021-12-14 ENCOUNTER — Encounter: Payer: Self-pay | Admitting: Adult Health

## 2021-12-14 VITALS — BP 124/78 | HR 71 | Ht 67.0 in | Wt 261.0 lb

## 2021-12-14 DIAGNOSIS — G43709 Chronic migraine without aura, not intractable, without status migrainosus: Secondary | ICD-10-CM | POA: Diagnosis not present

## 2021-12-14 NOTE — Patient Instructions (Signed)
-  Continue Emgality -Continue  Nurtec for abortive therapy. -Consider referral for sleep eval to r/o OSA.  -Follow-up in 6 months or sooner if needed

## 2021-12-14 NOTE — Progress Notes (Signed)
PATIENT: Sabrina Gray DOB: April 04, 1986  REASON FOR VISIT: follow up HISTORY FROM: patient Chief Complaint  Patient presents with   Follow-up    Rm 7, alone.  Migraines doing much better.  Need refill emaglity     HISTORY OF PRESENT ILLNESS: Today 12/14/21:  Sabrina Gray is a 36 year old female with a history of migraine headahes. Continues on emgality. Reports that she is having 1 mild headache a week and 1 migraine a month. Usually just uses OTC medication Uses Nurtec and it works ok. Reports that she sometimes wakes up with a headache but it will resolve spontaneously.  Denies daytime sleepiness. She does snore. Reports that she is trying to lose weight. Recently started weight watchers.  06/14/21: Sabrina Gray is a 36 year old female with a history of migraine headaches.  She returns today for follow-up.  The patient states that Emgality has not been beneficial for her.  She is having 4-5 migraines a month and a mild headache at least 5 days a week.  She typically takes ibuprofen or Tylenol as Maxalt makes her extremely sleepy and she is unable to take it at work.  She states that when she was on Ajovy her migraines reduced significantly.  She is unable to try Aimovig due to cardiac risk factors.  The patient has a diagnosis of MVP and cardiac rhythm irregularities per the patient.  HISTORY (copied from Dr. Trevor Mace note) Sabrina Gray is a 36 y.o. female here as requested by Jarrett Soho, PA-C for migrianes. PMHx anxiety, morbid obesity, hypertension, migraine without aura.  I reviewed Dr. Jeannette How notes, she is on metoprolol, Fioricet and Lexapro, she has frequent headaches more than 3/week, has seen headache specialist and Lewayne Bunting does not want to go back, she was placed on metoprolol for prevention of migraines, she is also had Botox injections before, she still has migraines 1-2 a month and tension headaches about once a week usually stress related, she is  taking Advil or Tylenol about 4 to 5 days/week.  Patient is here alone and she reports migraine started in college, they have gotten more frequent, Fioricet does not really help, she is also tried ibuprofen, Tylenol, Excedrin Migraine, Excedrin extra strength, Aleve and none help.Both mother and aunt are seen her. Starts right periorbital, horrible and miserable and they hurt. As they progress they can spread. Pulsating/pouding/throbbing/photo/phonophobia, nausea, occ dizziness, movement makes them worse, last 48-72 ours and be severe. 26/30 headache days and out of those, she will wake up with headaches, 8-10 migraine days a month that can be moderatelty-severe to severe. Unknown triggers. Her menses came make them worse or trigger them. Sleeping/dark room may help. OTC meds do no thelp she doesn't take them. She has never tried a triptan. She tried fioricet. She has mitral valve prolapse, fioricet hasn;t helped. Worse positionally supine in the morning when she wakes up with them. No other focal neurologic deficits, associated symptoms, inciting events or modifiable factors.  Reviewed notes, labs and imaging from outside physicians, which showed:  Medications tried that can be used in migraine management: lexapro, metoprolol, fioricet, excedrin, ibu,tylenol, mobic, medrol dosepak, nerve blocks  REVIEW OF SYSTEMS: Out of a complete 14 system review of symptoms, the patient complains only of the following symptoms, and all other reviewed systems are negative.  ALLERGIES: No Known Allergies  HOME MEDICATIONS: Outpatient Medications Prior to Visit  Medication Sig Dispense Refill   escitalopram (LEXAPRO) 10 MG tablet Take 10 mg by mouth at  bedtime.     Galcanezumab-gnlm (EMGALITY) 120 MG/ML SOAJ Inject 120 mg into the skin every 30 (thirty) days. 1 mL 2   metoprolol succinate (TOPROL-XL) 50 MG 24 hr tablet Take 50 mg by mouth daily.     Rimegepant Sulfate (NURTEC) 75 MG TBDP Take 1 tablet at the  onset of migraine. Only 1 tablet in 24 hours. 10 tablet 5   rizatriptan (MAXALT-MLT) 10 MG disintegrating tablet Take 1 tablet (10 mg total) by mouth as needed for migraine. May repeat in 2 hours if needed 9 tablet 11   No facility-administered medications prior to visit.    PAST MEDICAL HISTORY: Past Medical History:  Diagnosis Date   Anxiety    COVID    Migraine     PAST SURGICAL HISTORY: Past Surgical History:  Procedure Laterality Date   ELBOW SURGERY     reconstructive nerve surgery at 7 years ago.    FAMILY HISTORY: Family History  Problem Relation Age of Onset   Hypertension Father    Prostate cancer Paternal Grandfather    Heart disease Cousin    Leukemia Maternal Aunt    Migraines Mother    Migraines Maternal Aunt     SOCIAL HISTORY: Social History   Socioeconomic History   Marital status: Married    Spouse name: Not on file   Number of children: 1   Years of education: Not on file   Highest education level: Bachelor's degree (e.g., BA, AB, BS)  Occupational History   Not on file  Tobacco Use   Smoking status: Never   Smokeless tobacco: Never  Vaping Use   Vaping Use: Never used  Substance and Sexual Activity   Alcohol use: Not Currently   Drug use: Never   Sexual activity: Not on file  Other Topics Concern   Not on file  Social History Narrative   Lives at home with husband and daughter   Right handed   Caffeine: 2 cups/day   Social Determinants of Health   Financial Resource Strain: Not on file  Food Insecurity: Not on file  Transportation Needs: Not on file  Physical Activity: Not on file  Stress: Not on file  Social Connections: Not on file  Intimate Partner Violence: Not on file      PHYSICAL EXAM  Vitals:   12/14/21 0946  BP: 124/78  Pulse: 71  Weight: 261 lb (118.4 kg)  Height: 5\' 7"  (1.702 m)    Body mass index is 40.88 kg/m.  Generalized: Well developed, in no acute distress   Neurological examination   Mentation: Alert oriented to time, place, history taking. Follows all commands speech and language fluent Cranial nerve II-XII: Pupils were equal round reactive to light. Extraocular movements were full, visual field were full on confrontational test. Facial sensation and strength were normal. Head turning and shoulder shrug  were normal and symmetric. Motor: The motor testing reveals 5 over 5 strength of all 4 extremities. Good symmetric motor tone is noted throughout.  Sensory: Sensory testing is intact to soft touch on all 4 extremities. No evidence of extinction is noted.  Coordination: Cerebellar testing reveals good finger-nose-finger and heel-to-shin bilaterally.  Gait and station: Gait is normal. Reflexes: Deep tendon reflexes are symmetric and normal bilaterally.   DIAGNOSTIC DATA (LABS, IMAGING, TESTING) - I reviewed patient records, labs, notes, testing and imaging myself where available.  Lab Results  Component Value Date   WBC 12.1 (H) 07/06/2020   HGB 14.5 07/06/2020   HCT  41.9 07/06/2020   MCV 94 07/06/2020   PLT 381 07/06/2020      Component Value Date/Time   NA 139 07/06/2020 0950   K 4.6 07/06/2020 0950   CL 102 07/06/2020 0950   CO2 23 07/06/2020 0950   GLUCOSE 83 07/06/2020 0950   BUN 18 07/06/2020 0950   CREATININE 0.84 07/06/2020 0950   CALCIUM 10.0 07/06/2020 0950   PROT 7.4 07/06/2020 0950   ALBUMIN 4.7 07/06/2020 0950   AST 18 07/06/2020 0950   ALT 23 07/06/2020 0950   ALKPHOS 140 (H) 07/06/2020 0950   BILITOT 0.3 07/06/2020 0950   GFRNONAA 91 07/06/2020 0950   GFRAA 105 07/06/2020 0950    Lab Results  Component Value Date   TSH 1.610 07/06/2020      ASSESSMENT AND PLAN 36 y.o. year old female  has a past medical history of Anxiety, COVID, and Migraine. here with:  1.  Migraine headache  -Continue Emgality -Continue  Nurtec for abortive therapy. -Consider referral for sleep eval to r/o OSA.  -Follow-up in 6 months or sooner if  needed     Butch PennyMegan Pola Furno, MSN, NP-C 12/14/2021, 7:56 AM [facility] [address], [address] [place], [place] [id number] [phone]

## 2022-01-23 ENCOUNTER — Other Ambulatory Visit: Payer: Self-pay | Admitting: Adult Health

## 2022-02-05 ENCOUNTER — Ambulatory Visit: Payer: 59 | Admitting: Nurse Practitioner

## 2022-02-05 ENCOUNTER — Encounter: Payer: Self-pay | Admitting: Nurse Practitioner

## 2022-02-05 VITALS — BP 124/84 | HR 75 | Ht 66.5 in | Wt 262.0 lb

## 2022-02-05 DIAGNOSIS — R102 Pelvic and perineal pain: Secondary | ICD-10-CM | POA: Diagnosis not present

## 2022-02-05 DIAGNOSIS — N946 Dysmenorrhea, unspecified: Secondary | ICD-10-CM

## 2022-02-05 DIAGNOSIS — E282 Polycystic ovarian syndrome: Secondary | ICD-10-CM | POA: Diagnosis not present

## 2022-02-05 MED ORDER — ETONOGESTREL-ETHINYL ESTRADIOL 0.12-0.015 MG/24HR VA RING: 1.0000 | VAGINAL_RING | VAGINAL | 1 refills | Status: DC

## 2022-02-05 NOTE — Progress Notes (Signed)
   Acute Office Visit  Subjective:    Patient ID: Sabrina Gray, female    DOB: 02-23-1986, 36 y.o.   MRN: [id number]   HPI 36 y.o. G2P0011 presents today for abdominal pain since August 2022. Pain initially was occurring mid cycle only, but now is with menses and sometimes 1-2 times per month. Pain is in lower abdomen but at times has radiated up right side. Pain lasts hours to days. She even had nausea with it recently. Normal ultrasound 02/2021.  Monthly cycles with moderate flow and dysmenorrhea. Reports worsening menses x 1.5 years. H/O PCOS. Denies GI symptoms. Normal pap history, most recent 03/09/2021. Husband has vasectomy. She has been on Nuvaring in the past and thought it was causing headaches, but they did not improve after discontinuation. She is now on Emgality for migraine without aura.   Menstrual history Patient's last menstrual period was 01/30/2022 (exact date). Period Cycle (Days): 30 Period Duration (Days): 4-5 Period Pattern: Regular Menstrual Flow: Moderate Menstrual Control: Tampon, Maxi pad Dysmenorrhea: (!) Moderate (cramps worsening in last 1.5 years) Dysmenorrhea Symptoms: Cramping, Nausea (headache 1 week prior to menses)  Review of Systems  Constitutional: Negative.   Gastrointestinal:  Negative for abdominal distention, constipation, diarrhea, nausea and vomiting.  Genitourinary:  Positive for pelvic pain. Negative for vaginal bleeding and vaginal discharge.       Objective:    Physical Exam Constitutional:      Appearance: Normal appearance.  Abdominal:     Tenderness: There is no abdominal tenderness. There is no guarding or rebound.  Genitourinary:    General: Normal vulva.     Vagina: Normal.     Cervix: Normal.     Uterus: Normal.      BP 124/84   Pulse 75   Ht 5' 6.5" (1.689 m)   Wt 262 lb (118.8 kg)   LMP 01/30/2022 (Exact Date)   SpO2 97%   BMI 41.65 kg/m  Wt Readings from Last 3 Encounters:  02/05/22 262 lb (118.8 kg)   12/14/21 261 lb (118.4 kg)  06/14/21 258 lb 12.8 oz (117.4 kg)        Patient informed chaperone available to be present for breast and/or pelvic exam. Patient has requested no chaperone to be present. Patient has been advised what will be completed during breast and pelvic exam.   Assessment & Plan:   Problem List Items Addressed This Visit   None Visit Diagnoses     Pelvic pain    -  Primary   Relevant Medications   etonogestrel-ethinyl estradiol (NUVARING) 0.12-0.015 MG/24HR vaginal ring   Dysmenorrhea       Relevant Medications   etonogestrel-ethinyl estradiol (NUVARING) 0.12-0.015 MG/24HR vaginal ring   PCOS (polycystic ovarian syndrome)       Relevant Medications   etonogestrel-ethinyl estradiol (NUVARING) 0.12-0.015 MG/24HR vaginal ring      Plan: Discussed ovarian cysts versus endometriosis and management options. She would like to try Nuvaring as she did well on this in the past. We discussed other contraceptive options as well. All questions answered.      Olivia Mackie DNP, 12:22 PM 02/05/2022

## 2022-03-12 ENCOUNTER — Encounter: Payer: Self-pay | Admitting: Nurse Practitioner

## 2022-03-12 ENCOUNTER — Other Ambulatory Visit: Payer: Self-pay | Admitting: Nurse Practitioner

## 2022-03-12 DIAGNOSIS — N946 Dysmenorrhea, unspecified: Secondary | ICD-10-CM

## 2022-03-12 DIAGNOSIS — E282 Polycystic ovarian syndrome: Secondary | ICD-10-CM

## 2022-03-12 MED ORDER — NORETHINDRONE 0.35 MG PO TABS: 1.0000 | ORAL_TABLET | Freq: Every day | ORAL | 1 refills | Status: DC

## 2022-03-12 NOTE — Telephone Encounter (Signed)
Pharmacy set

## 2022-04-30 ENCOUNTER — Other Ambulatory Visit: Payer: Self-pay | Admitting: Adult Health

## 2022-05-03 ENCOUNTER — Encounter: Payer: Self-pay | Admitting: Adult Health

## 2022-08-28 ENCOUNTER — Other Ambulatory Visit: Payer: Self-pay | Admitting: Nurse Practitioner

## 2022-08-28 DIAGNOSIS — N946 Dysmenorrhea, unspecified: Secondary | ICD-10-CM

## 2022-08-28 DIAGNOSIS — E282 Polycystic ovarian syndrome: Secondary | ICD-10-CM

## 2022-08-28 NOTE — Telephone Encounter (Signed)
RF request received for Norethindrone 0.60m.  Patient has AEX scheduled 02/07/23.  RF sent.

## 2022-11-29 ENCOUNTER — Encounter: Payer: Self-pay | Admitting: Obstetrics and Gynecology

## 2022-11-29 ENCOUNTER — Ambulatory Visit: Payer: 59 | Admitting: Nurse Practitioner

## 2022-11-29 ENCOUNTER — Ambulatory Visit: Payer: 59 | Admitting: Obstetrics and Gynecology

## 2022-11-29 VITALS — BP 122/74 | HR 95 | Ht 67.0 in | Wt 268.0 lb

## 2022-11-29 DIAGNOSIS — N760 Acute vaginitis: Secondary | ICD-10-CM

## 2022-11-29 LAB — WET PREP FOR TRICH, YEAST, CLUE

## 2022-11-29 MED ORDER — METRONIDAZOLE 500 MG PO TABS: 500.0000 mg | ORAL_TABLET | Freq: Two times a day (BID) | ORAL | 0 refills | Status: DC

## 2022-11-29 NOTE — Patient Instructions (Signed)

## 2022-11-29 NOTE — Progress Notes (Signed)
GYNECOLOGY  VISIT   HPI: 37 y.o.   Married White or Caucasian Not Hispanic or Latino  female   [phone] with Patient's last menstrual period was 11/08/2022.   here for  BV. Pt noticed discharge and odor. Symptoms started 1.5 weeks ago. No itching, mild burning.  Sexually active, no STD concerns.  GYNECOLOGIC HISTORY: Patient's last menstrual period was 11/08/2022. Contraception: OCP/ vasectomy Menopausal hormone therapy: n/a        OB History     Gravida  2   Para  1   Term      Preterm      AB  1   Living  1      SAB  1   IAB      Ectopic      Multiple      Live Births                 Patient Active Problem List   Diagnosis Date Noted   Chronic migraine without aura without status migrainosus, not intractable 07/06/2020   Migraine 01/30/2020    Past Medical History:  Diagnosis Date   Anxiety    COVID    History of PCOS    Migraine    MVP (mitral valve prolapse)     Past Surgical History:  Procedure Laterality Date   ANAL FISSURE REPAIR     in college   ELBOW SURGERY     reconstructive nerve surgery at 7 years ago.    Current Outpatient Medications  Medication Sig Dispense Refill   EMGALITY 120 MG/ML SOAJ INJECT 120 MG INTO THE SKIN EVERY 30 (THIRTY) DAYS. 1 mL 7   escitalopram (LEXAPRO) 10 MG tablet Take 10 mg by mouth at bedtime.     metoprolol succinate (TOPROL-XL) 50 MG 24 hr tablet Take 50 mg by mouth daily.     norethindrone (MICRONOR) 0.35 MG tablet TAKE 1 TABLET BY MOUTH EVERY DAY 84 tablet 1   Rimegepant Sulfate (NURTEC) 75 MG TBDP Take 1 tablet at the onset of migraine. Only 1 tablet in 24 hours. 10 tablet 5   No current facility-administered medications for this visit.     ALLERGIES: Patient has no known allergies.  Family History  Problem Relation Age of Onset   Migraines Mother    Hypertension Father    Leukemia Maternal Aunt    Migraines Maternal Aunt    Thyroid disease Maternal Grandmother    Prostate cancer Paternal  Grandfather    Heart disease Cousin     Social History   Socioeconomic History   Marital status: Married    Spouse name: Not on file   Number of children: 1   Years of education: Not on file   Highest education level: Bachelor's degree (e.g., BA, AB, BS)  Occupational History   Not on file  Tobacco Use   Smoking status: Never   Smokeless tobacco: Never  Vaping Use   Vaping Use: Never used  Substance and Sexual Activity   Alcohol use: Not Currently   Drug use: Never   Sexual activity: Yes    Comment: Husband w/vasectomy/first intercourse>16, less than 5 partners  Other Topics Concern   Not on file  Social History Narrative   Lives at home with husband and daughter   Right handed   Caffeine: 2 cups/day   Social Determinants of Health   Financial Resource Strain: Not on file  Food Insecurity: Not on file  Transportation Needs: Not on file  Physical Activity: Not on file  Stress: Not on file  Social Connections: Not on file  Intimate Partner Violence: Not on file    Review of Systems  All other systems reviewed and are negative.   PHYSICAL EXAMINATION:    BP 122/74 (BP Location: Right Arm, Patient Position: Sitting, Cuff Size: Large)   Pulse 95   Ht 5\' 7"  (1.702 m)   Wt 268 lb (121.6 kg)   LMP 11/08/2022   SpO2 98%   BMI 41.97 kg/m     General appearance: alert, cooperative and appears stated age  Pelvic: External genitalia:  no lesions              Urethra:  normal appearing urethra with no masses, tenderness or lesions              Bartholins and Skenes: normal                 Vagina: normal appearing vagina with a slight increase in thin, white vaginal d/c              Cervix: no lesions                Chaperone was present for exam.  1. Acute vaginitis - WET PREP FOR TRICH, YEAST, CLUE  2. BV (bacterial vaginosis) - metroNIDAZOLE (FLAGYL) 500 MG tablet; Take 1 tablet (500 mg total) by mouth 2 (two) times daily.  Dispense: 14 tablet; Refill: 0

## 2022-12-16 IMAGING — US US PELVIS COMPLETE WITH TRANSVAGINAL
1 series · 14 of 25 positions shown · non-contrast
Comparison: None

CLINICAL DATA: Low pelvic pain

EXAM:
TRANSABDOMINAL AND TRANSVAGINAL ULTRASOUND OF PELVIS
TECHNIQUE: Both transabdominal and transvaginal ultrasound examinations of the
pelvis were performed. Transabdominal technique was performed for
global imaging of the pelvis including uterus, ovaries, adnexal
regions, and pelvic cul-de-sac. It was necessary to proceed with
endovaginal exam following the transabdominal exam to visualize the
uterus endometrium ovaries.

[Series 1: us pelvis complete with transvaginal · 0.26mm/px · 14 of 59 slices shown]
[im 1/59]
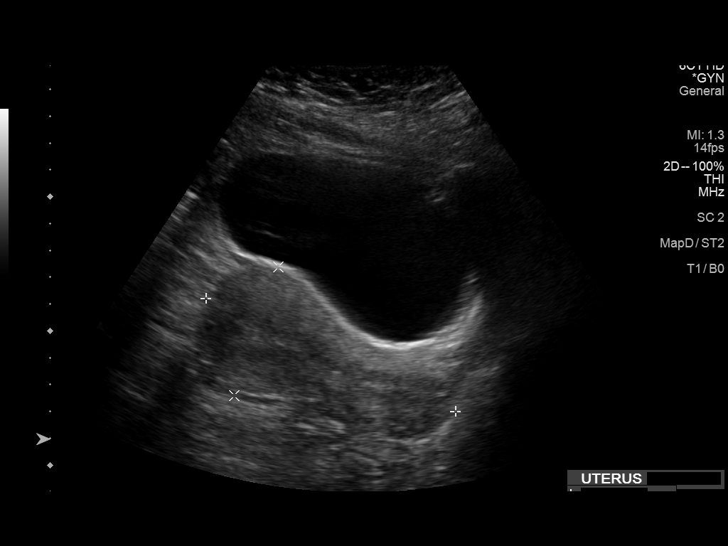
[im 5/59]
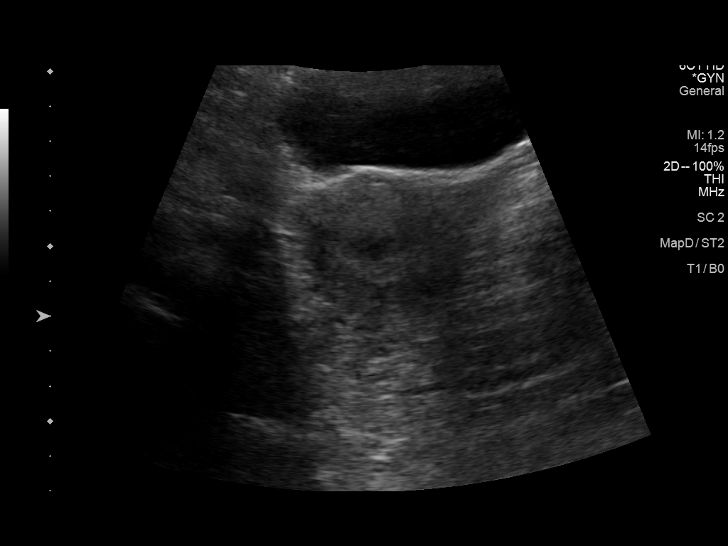
[im 10/59]
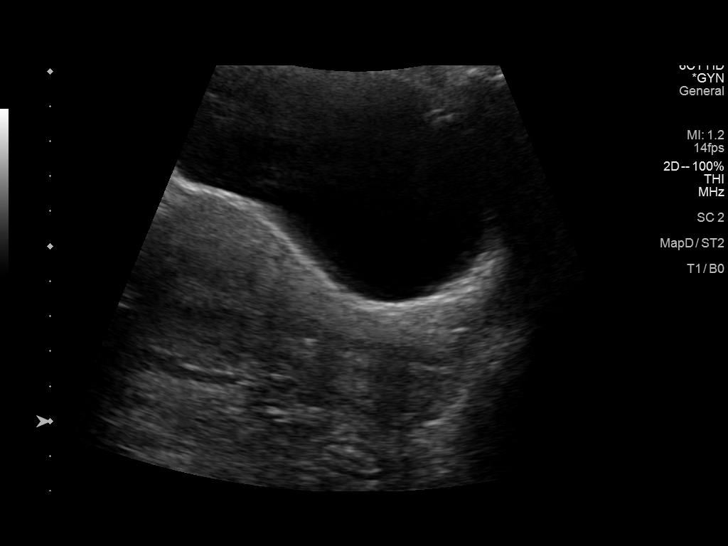
[im 15/59]
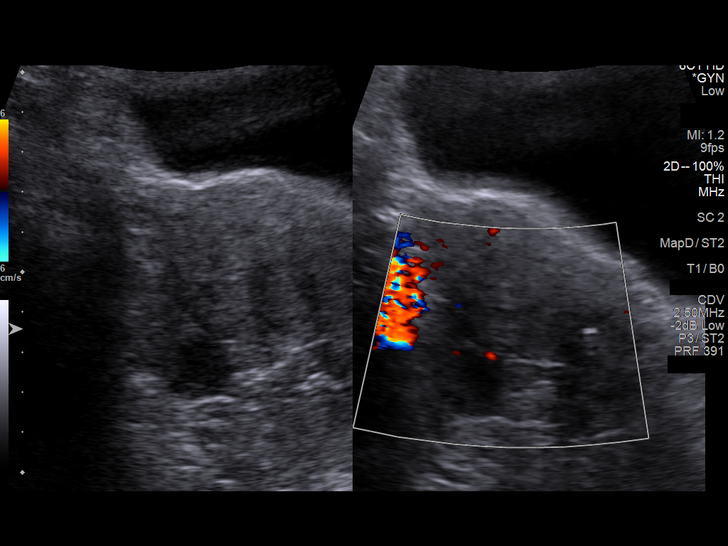
[im 20/59]
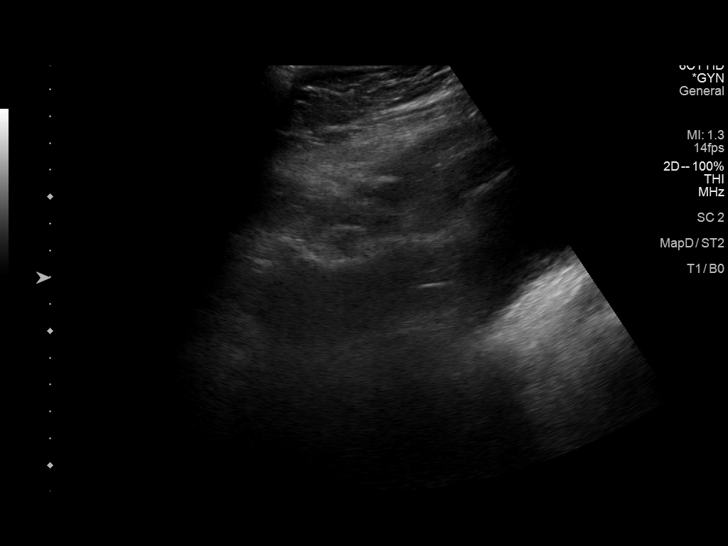
[im 22/59]
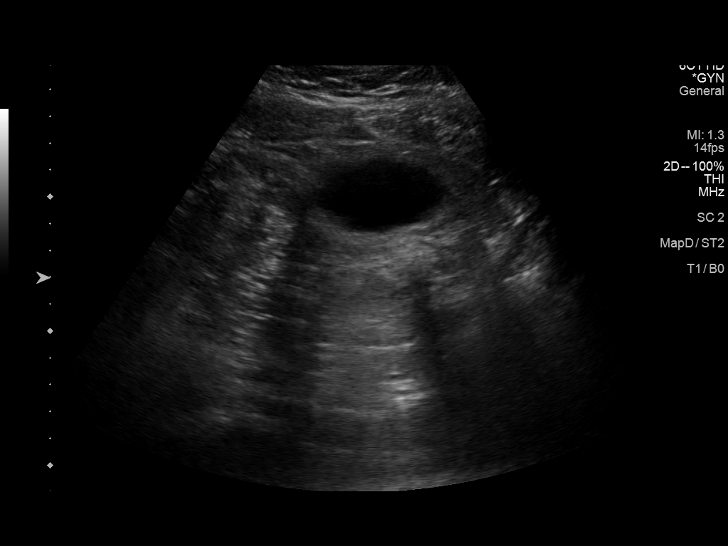
[im 27/59]
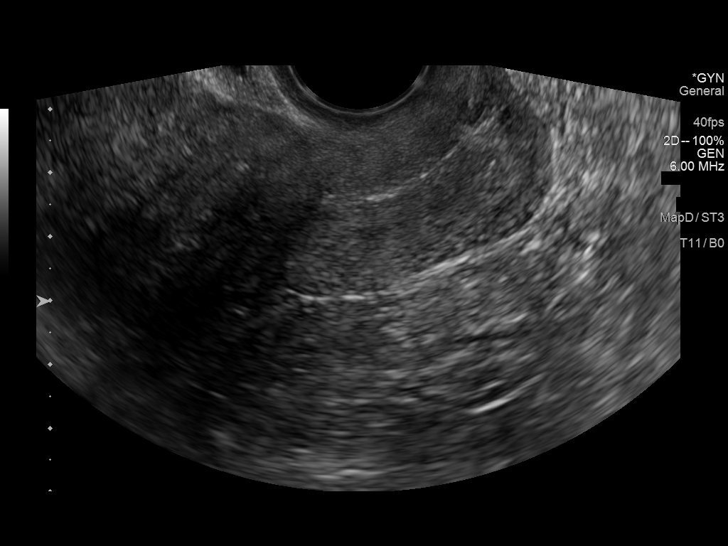
[im 32/59]
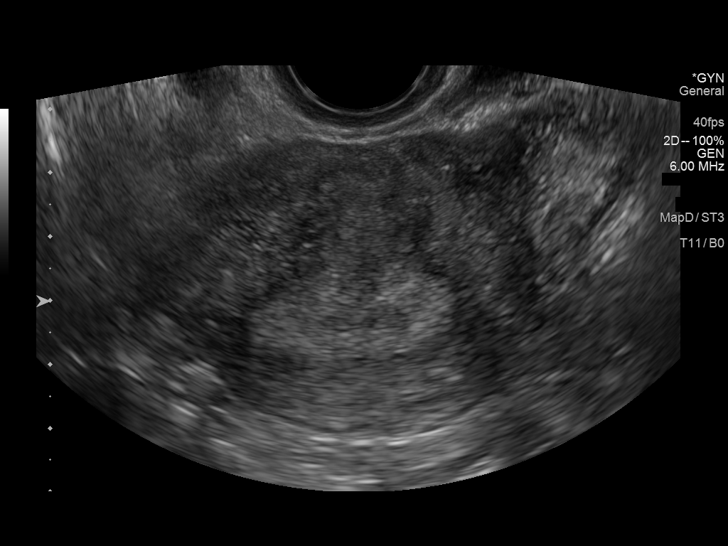
[im 37/59]
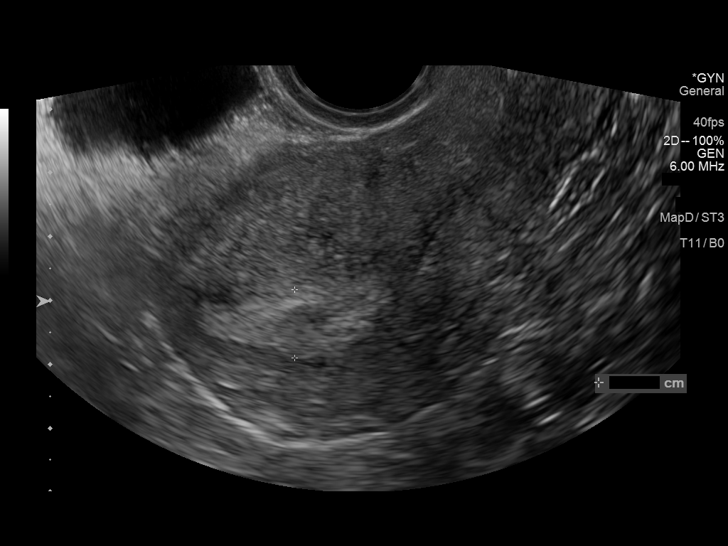
[im 39/59]
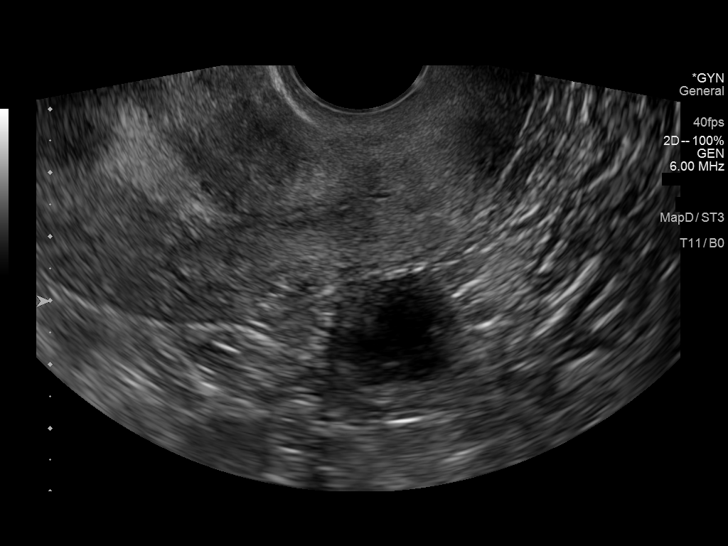
[im 44/59]
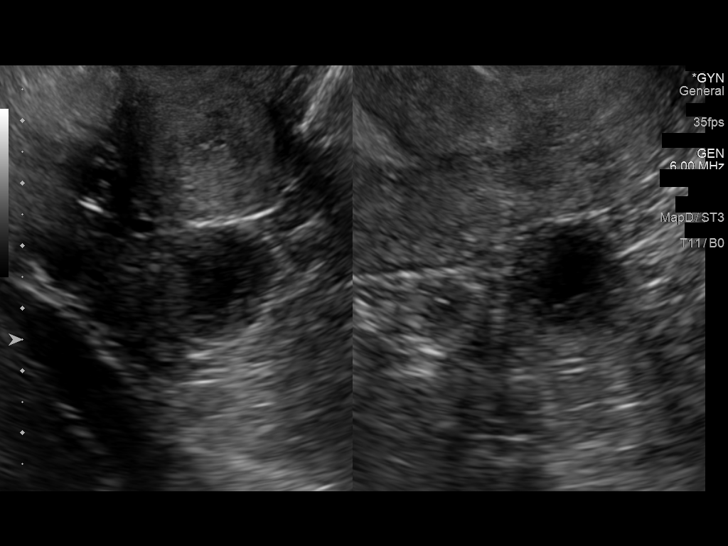
[im 49/59]
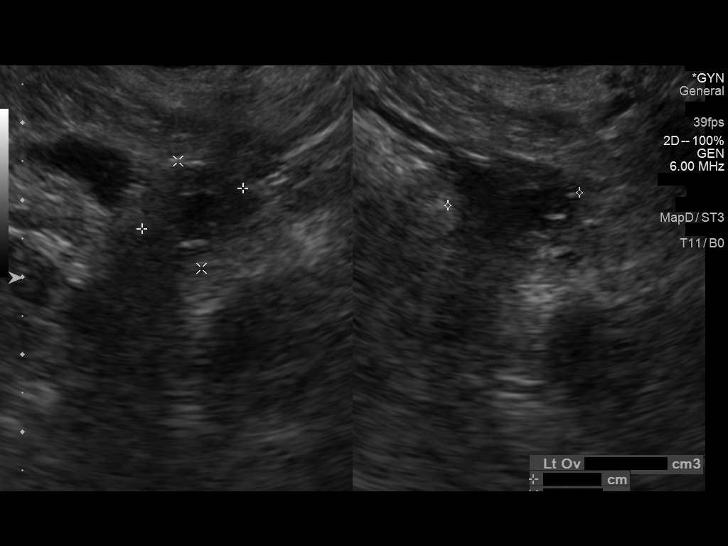
[im 54/59]
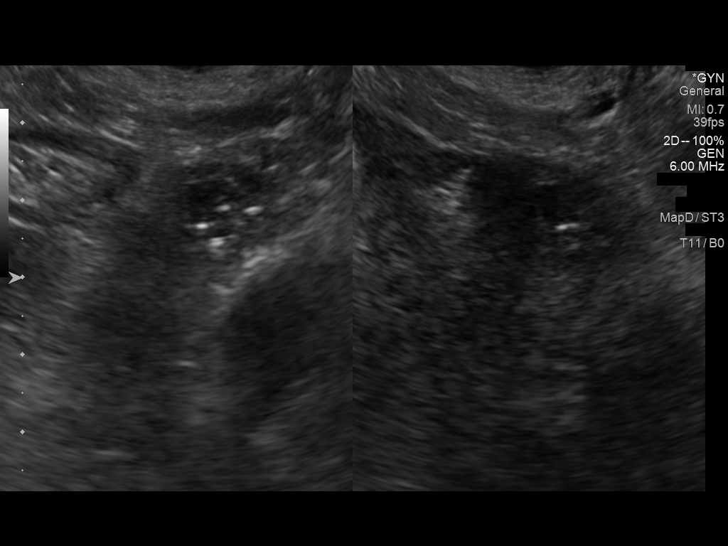
[im 59/59]
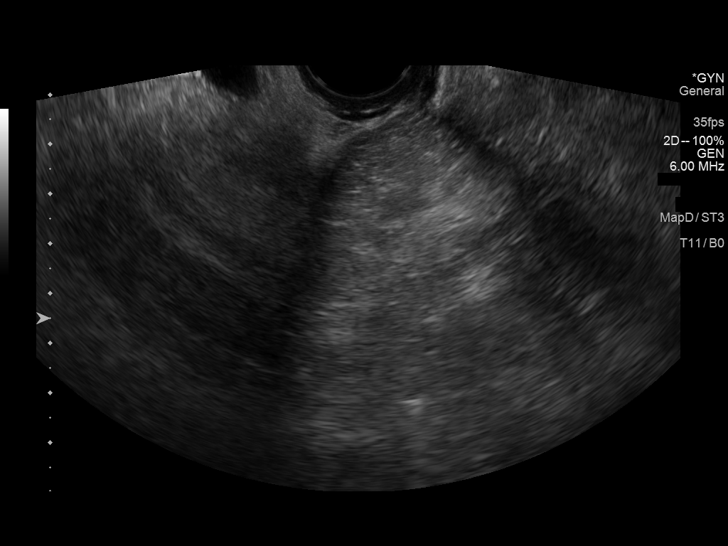

[14 of 25 positions shown; findings below may reference images not displayed]

FINDINGS: Uterus

Measurements: 10.2 x 5.1 x 5 cm = volume: 135 mL. No fibroids or
other mass visualized.

Endometrium

Thickness: 10.7 mm.  No focal abnormality visualized.

Right ovary

Measurements: 3.6 x 2.3 x 2.7 cm = volume: 11.6 mL. Probable corpus
luteum in the right ovary measuring 1.1 cm, no further follow-up
recommended.

Left ovary

Measurements: 1.4 x 1.4 x 1.7 cm = volume: 1.8 mL. Normal
appearance/no adnexal mass.

Other findings

No abnormal free fluid.
IMPRESSION: Negative pelvic ultrasound

## 2022-12-25 ENCOUNTER — Telehealth: Payer: 59 | Admitting: Adult Health

## 2022-12-25 DIAGNOSIS — G43009 Migraine without aura, not intractable, without status migrainosus: Secondary | ICD-10-CM

## 2022-12-25 DIAGNOSIS — G43709 Chronic migraine without aura, not intractable, without status migrainosus: Secondary | ICD-10-CM

## 2022-12-25 MED ORDER — EMGALITY 120 MG/ML ~~LOC~~ SOAJ: 120.0000 mg | SUBCUTANEOUS | 11 refills | Status: DC

## 2022-12-25 MED ORDER — NURTEC 75 MG PO TBDP: ORAL_TABLET | ORAL | 11 refills | Status: DC

## 2022-12-25 NOTE — Progress Notes (Signed)
PATIENT: Sabrina Gray DOB: 1985/09/24  REASON FOR VISIT: follow up HISTORY FROM: patient  Virtual Visit via Video Note  I connected with Sabrina Gray on 12/25/22 at  1:45 PM EDT by a video enabled telemedicine application located remotely at [facility] and verified that I am speaking with the correct person using two identifiers who was located at their own home.   I discussed the limitations of evaluation and management by telemedicine and the availability of in person appointments. The patient expressed understanding and agreed to proceed.   PATIENT: Sabrina Gray DOB: 1985-09-08  REASON FOR VISIT: follow up HISTORY FROM: patient  HISTORY OF PRESENT ILLNESS: Today 12/25/22:  Sabrina Gray is a 37 y.o. female with a history of Migraine headaches. Returns today for follow-up.  May get a mild headache once a week.  Over-the-counter medication usually eliminates these headaches.  Migraine every 2-3 months.  She does take Nurtec but it still takes about 24 hours for the headache to her resolve.  She states that this is a minor inconvenience considering how infrequent she is having migraines at this point.    HISTORY 12/14/21:   Sabrina Gray is a 37 year old female with a history of migraine headahes. Continues on emgality. Reports that she is having 1 mild headache a week and 1 migraine a month. Usually just uses OTC medication Uses Nurtec and it works ok. Reports that she sometimes wakes up with a headache but it will resolve spontaneously.  Denies daytime sleepiness. She does snore. Reports that she is trying to lose weight. Recently started weight watchers.   06/14/21: Sabrina Gray is a 37 year old female with a history of migraine headaches.  She returns today for follow-up.  The patient states that Emgality has not been beneficial for her.  She is having 4-5 migraines a month and a mild headache at least 5 days  a week.  She typically takes ibuprofen or Tylenol as Maxalt makes her extremely sleepy and she is unable to take it at work.  She states that when she was on Ajovy her migraines reduced significantly.  She is unable to try Aimovig due to cardiac risk factors.  The patient has a diagnosis of MVP and cardiac rhythm irregularities per the patient.   HISTORY (copied from Dr. Trevor Mace note) Sabrina Gray is a 37 y.o. female here as requested by Jarrett Soho, PA-C for migrianes. PMHx anxiety, morbid obesity, hypertension, migraine without aura.  I reviewed Dr. Jeannette How notes, she is on metoprolol, Fioricet and Lexapro, she has frequent headaches more than 3/week, has seen headache specialist and Lewayne Bunting does not want to go back, she was placed on metoprolol for prevention of migraines, she is also had Botox injections before, she still has migraines 1-2 a month and tension headaches about once a week usually stress related, she is taking Advil or Tylenol about 4 to 5 days/week.   Patient is here alone and she reports migraine started in college, they have gotten more frequent, Fioricet does not really help, she is also tried ibuprofen, Tylenol, Excedrin Migraine, Excedrin extra strength, Aleve and none help.Both mother and aunt are seen her. Starts right periorbital, horrible and miserable and they hurt. As they progress they can spread. Pulsating/pouding/throbbing/photo/phonophobia, nausea, occ dizziness, movement makes them worse, last 48-72 ours and be severe. 26/30 headache days and out of those, she will wake up with headaches, 8-10 migraine days a month that can be moderatelty-severe to  severe. Unknown triggers. Her menses came make them worse or trigger them. Sleeping/dark room may help. OTC meds do no thelp she doesn't take them. She has never tried a triptan. She tried fioricet. She has mitral valve prolapse, fioricet hasn;t helped. Worse positionally supine in the morning when she wakes up with  them. No other focal neurologic deficits, associated symptoms, inciting events or modifiable factors.   Reviewed notes, labs and imaging from outside physicians, which showed:   Medications tried that can be used in migraine management: lexapro, metoprolol, fioricet, excedrin, ibu,tylenol, mobic, medrol dosepak, nerve blocks  REVIEW OF SYSTEMS: Out of a complete 14 system review of symptoms, the patient complains only of the following symptoms, and all other reviewed systems are negative.  ALLERGIES: No Known Allergies  HOME MEDICATIONS: Outpatient Medications Prior to Visit  Medication Sig Dispense Refill   EMGALITY 120 MG/ML SOAJ INJECT 120 MG INTO THE SKIN EVERY 30 (THIRTY) DAYS. 1 mL 7   escitalopram (LEXAPRO) 10 MG tablet Take 10 mg by mouth at bedtime.     metoprolol succinate (TOPROL-XL) 50 MG 24 hr tablet Take 50 mg by mouth daily.     metroNIDAZOLE (FLAGYL) 500 MG tablet Take 1 tablet (500 mg total) by mouth 2 (two) times daily. 14 tablet 0   norethindrone (MICRONOR) 0.35 MG tablet TAKE 1 TABLET BY MOUTH EVERY DAY 84 tablet 1   Rimegepant Sulfate (NURTEC) 75 MG TBDP Take 1 tablet at the onset of migraine. Only 1 tablet in 24 hours. 10 tablet 5   No facility-administered medications prior to visit.    PAST MEDICAL HISTORY: Past Medical History:  Diagnosis Date   Anxiety    COVID    History of PCOS    Migraine    MVP (mitral valve prolapse)     PAST SURGICAL HISTORY: Past Surgical History:  Procedure Laterality Date   ANAL FISSURE REPAIR     in college   ELBOW SURGERY     reconstructive nerve surgery at 7 years ago.    FAMILY HISTORY: Family History  Problem Relation Age of Onset   Migraines Mother    Hypertension Father    Leukemia Maternal Aunt    Migraines Maternal Aunt    Thyroid disease Maternal Grandmother    Prostate cancer Paternal Grandfather    Heart disease Cousin     SOCIAL HISTORY: Social History   Socioeconomic History   Marital status:  Married    Spouse name: Not on file   Number of children: 1   Years of education: Not on file   Highest education level: Bachelor's degree (e.g., BA, AB, BS)  Occupational History   Not on file  Tobacco Use   Smoking status: Never   Smokeless tobacco: Never  Vaping Use   Vaping Use: Never used  Substance and Sexual Activity   Alcohol use: Not Currently   Drug use: Never   Sexual activity: Yes    Comment: Husband w/vasectomy/first intercourse>16, less than 5 partners  Other Topics Concern   Not on file  Social History Narrative   Lives at home with husband and daughter   Right handed   Caffeine: 2 cups/day   Social Determinants of Health   Financial Resource Strain: Not on file  Food Insecurity: Not on file  Transportation Needs: Not on file  Physical Activity: Not on file  Stress: Not on file  Social Connections: Not on file  Intimate Partner Violence: Not on file  PHYSICAL EXAM Generalized: Well developed, in no acute distress   Neurological examination  Mentation: Alert oriented to time, place, history taking. Follows all commands speech and language fluent Cranial nerve II-XII: Facial symmetry noted  DIAGNOSTIC DATA (LABS, IMAGING, TESTING) - I reviewed patient records, labs, notes, testing and imaging myself where available.  Lab Results  Component Value Date   WBC 12.1 (H) 07/06/2020   HGB 14.5 07/06/2020   HCT 41.9 07/06/2020   MCV 94 07/06/2020   PLT 381 07/06/2020      Component Value Date/Time   NA 139 07/06/2020 0950   K 4.6 07/06/2020 0950   CL 102 07/06/2020 0950   CO2 23 07/06/2020 0950   GLUCOSE 83 07/06/2020 0950   BUN 18 07/06/2020 0950   CREATININE 0.84 07/06/2020 0950   CALCIUM 10.0 07/06/2020 0950   PROT 7.4 07/06/2020 0950   ALBUMIN 4.7 07/06/2020 0950   AST 18 07/06/2020 0950   ALT 23 07/06/2020 0950   ALKPHOS 140 (H) 07/06/2020 0950   BILITOT 0.3 07/06/2020 0950   GFRNONAA 91 07/06/2020 0950   GFRAA 105 07/06/2020 0950     Lab Results  Component Value Date   TSH 1.610 07/06/2020      ASSESSMENT AND PLAN 37 y.o. year old female  has a past medical history of Anxiety, COVID, History of PCOS, Migraine, and MVP (mitral valve prolapse). here with:   1.  Migraine headaches  Continue Emgality monthly injections Continue Nurtec for abortive therapy Advised if headache frequency or severity increases she should let us know Follow-up in 1 year or sooner if needed     Sabrina Penny, MSN, NP-C 12/25/2022, 1:37 PM [facility] [address], [address] [place], [place] [address] [phone]

## 2023-02-07 ENCOUNTER — Encounter: Payer: Self-pay | Admitting: Nurse Practitioner

## 2023-02-07 ENCOUNTER — Ambulatory Visit (INDEPENDENT_AMBULATORY_CARE_PROVIDER_SITE_OTHER): Payer: 59 | Admitting: Nurse Practitioner

## 2023-02-07 VITALS — BP 116/72 | HR 77 | Ht 67.0 in | Wt 269.0 lb

## 2023-02-07 DIAGNOSIS — E282 Polycystic ovarian syndrome: Secondary | ICD-10-CM | POA: Diagnosis not present

## 2023-02-07 DIAGNOSIS — B3731 Acute candidiasis of vulva and vagina: Secondary | ICD-10-CM | POA: Diagnosis not present

## 2023-02-07 DIAGNOSIS — Z01419 Encounter for gynecological examination (general) (routine) without abnormal findings: Secondary | ICD-10-CM

## 2023-02-07 DIAGNOSIS — N946 Dysmenorrhea, unspecified: Secondary | ICD-10-CM | POA: Diagnosis not present

## 2023-02-07 DIAGNOSIS — N898 Other specified noninflammatory disorders of vagina: Secondary | ICD-10-CM

## 2023-02-07 LAB — WET PREP FOR TRICH, YEAST, CLUE

## 2023-02-07 MED ORDER — NORETHINDRONE 0.35 MG PO TABS: 1.0000 | ORAL_TABLET | Freq: Every day | ORAL | 3 refills | Status: DC

## 2023-02-07 MED ORDER — FLUCONAZOLE 150 MG PO TABS: 150.0000 mg | ORAL_TABLET | ORAL | 0 refills | Status: DC

## 2023-02-07 NOTE — Progress Notes (Addendum)
   Sabrina Gray 05/07/1986 [id number]   History:  37 y.o. G2P0011 presents for annual exam. Monthly cycles. POPs. Experienced daily headaches with Nuvaring. H/O PCOS, migraines without aura. Complains of vaginal itching and burning. Treated for BV in May and symptoms resolved but then went on a trip a couple of week later and symptoms returned.   Gynecologic History Patient's last menstrual period was 01/31/2023. Period Cycle (Days): 28 Period Duration (Days): 4-5 Period Pattern: Regular Menstrual Flow: Moderate Menstrual Control: Tampon Menstrual Control Change Freq (Hours): 4-6 Dysmenorrhea: (!) Moderate Dysmenorrhea Symptoms: Cramping Contraception/Family planning: oral progesterone-only contraceptive and vasectomy Sexually active: Yes  Health Maintenance Last Pap: 03/09/2021. Results were: Normal Last mammogram: Not indicated Last colonoscopy: Not indicated Last Dexa: Not indicated   Past medical history, past surgical history, family history and social history were all reviewed and documented in the EPIC chart. Married. Works in [employer] for [employer]. 37 yo daughter.   ROS:  A ROS was performed and pertinent positives and negatives are included.  Exam:  Vitals:   02/07/23 0757  BP: 116/72  Pulse: 77  SpO2: 100%  Weight: 269 lb (122 kg)  Height: 5\' 7"  (1.702 m)   Body mass index is 42.13 kg/m.  General appearance:  Normal Thyroid:  Symmetrical, normal in size, without palpable masses or nodularity. Respiratory  Auscultation:  Clear without wheezing or rhonchi Cardiovascular  Auscultation:  Regular rate, without rubs, murmurs or gallops  Edema/varicosities:  Not grossly evident Abdominal  Soft,nontender, without masses, guarding or rebound.  Liver/spleen:  No organomegaly noted  Hernia:  None appreciated  Skin  Inspection:  Grossly normal Breasts: Examined lying and sitting.   Right: Without masses, retractions, nipple discharge or axillary  adenopathy.   Left: Without masses, retractions, nipple discharge or axillary adenopathy. Genitourinary   Inguinal/mons:  Normal without inguinal adenopathy  External genitalia:  Generalized redness  BUS/Urethra/Skene's glands:  Normal  Vagina:  Discharge present, mild erythema  Cervix:  Normal appearing without discharge or lesions  Uterus:  Normal in size, shape and contour.  Midline and mobile, nontender  Adnexa/parametria:     Rt: Normal in size, without masses or tenderness.   Lt: Normal in size, without masses or tenderness.  Anus and perineum: Normal  Digital rectal exam: Deferred  Patient informed chaperone available to be present for breast and pelvic exam. Patient has requested no chaperone to be present. Patient has been advised what will be completed during breast and pelvic exam.   Wet prep + yeast (hyphae and budding noted)  Assessment/Plan:  37 y.o. G2P0011 for annual exam.   Well female exam with routine gynecological exam - Education provided on SBEs, importance of preventative screenings, current guidelines, high calcium diet, regular exercise, and multivitamin daily. Labs with PCP.   PCOS (polycystic ovarian syndrome) - Plan: norethindrone (MICRONOR) 0.35 MG tablet daily. Doing well on this. Refill x 1 year provided.   Dysmenorrhea - Plan: norethindrone (MICRONOR) 0.35 MG tablet daily. Good management.   Vaginal itching - Plan: WET PREP FOR TRICH, YEAST, CLUE. + yeast  Vaginal candidiasis - Plan: fluconazole (DIFLUCAN) 150 MG tablet today and repeat in 3 days if symptoms persist.   Screening for cervical cancer - Normal Pap history.  Will repeat at 3-year interval per guidelines.  Return in 1 year for annual or sooner if needed.     Olivia Mackie DNP, 8:25 AM 02/07/2023

## 2023-12-24 ENCOUNTER — Telehealth (INDEPENDENT_AMBULATORY_CARE_PROVIDER_SITE_OTHER): Payer: 59 | Admitting: Adult Health

## 2023-12-24 DIAGNOSIS — G43009 Migraine without aura, not intractable, without status migrainosus: Secondary | ICD-10-CM

## 2023-12-24 DIAGNOSIS — G43709 Chronic migraine without aura, not intractable, without status migrainosus: Secondary | ICD-10-CM

## 2023-12-24 MED ORDER — EMGALITY 120 MG/ML ~~LOC~~ SOAJ: 120.0000 mg | SUBCUTANEOUS | 3 refills | Status: AC

## 2023-12-24 NOTE — Progress Notes (Signed)
 PATIENT: Sabrina Gray DOB: 02-02-1986  REASON FOR VISIT: follow up HISTORY FROM: patient  Virtual Visit via Video Note  I connected with Sabrina Gray on 12/24/23 at  2:15 PM EDT by a video enabled telemedicine application located remotely at [facility] and verified that I am speaking with the correct person using two identifiers who was located at their own home in [place].    I discussed the limitations of evaluation and management by telemedicine and the availability of in person appointments. The patient expressed understanding and agreed to proceed.   PATIENT: Sabrina Gray DOB: 01-08-1986  REASON FOR VISIT: follow up HISTORY FROM: patient  HISTORY OF PRESENT ILLNESS: Today 12/24/23:  Sabrina Gray is a 38 y.o. female with a history of migraine headaches. Returns today for follow-up.  Overall she feels that she is doing well.  She gets approximately 1 migraine every 2 months.  Has not used Nurtec in over a year.  She denies an aura with her migraines.  Denies any associated symptoms such as numbness weakness or tingling.  Overall she feels that she is doing well.  Returns today for an evaluation  12/25/22: Sabrina Gray is a 38 y.o. female with a history of Migraine headaches. Returns today for follow-up.  May get a mild headache once a week.  Over-the-counter medication usually eliminates these headaches.  Migraine every 2-3 months.  She does take Nurtec but it still takes about 24 hours for the headache to her resolve.  She states that this is a minor inconvenience considering how infrequent she is having migraines at this point.   12/14/21: Sabrina Gray is a 38 year old female with a history of migraine headahes. Continues on emgality . Reports that she is having 1 mild headache a week and 1 migraine a month. Usually just uses OTC medication Uses Nurtec and it works ok. Reports that she sometimes wakes up  with a headache but it will resolve spontaneously.  Denies daytime sleepiness. She does snore. Reports that she is trying to lose weight. Recently started weight watchers.   06/14/21: Sabrina Gray is a 38 year old female with a history of migraine headaches.  She returns today for follow-up.  The patient states that Emgality  has not been beneficial for her.  She is having 4-5 migraines a month and a mild headache at least 5 days a week.  She typically takes ibuprofen or Tylenol as Maxalt  makes her extremely sleepy and she is unable to take it at work.  She states that when she was on Ajovy  her migraines reduced significantly.  She is unable to try Aimovig due to cardiac risk factors.  The patient has a diagnosis of MVP and cardiac rhythm irregularities per the patient.   HISTORY (copied from Dr. Harding Li note) Sabrina Gray is a 38 y.o. female here as requested by Darnelle Elders, PA-C for migrianes. PMHx anxiety, morbid obesity, hypertension, migraine without aura.  I reviewed Dr. Tommye Franc notes, she is on metoprolol, Fioricet and Lexapro, she has frequent headaches more than 3/week, has seen headache specialist and Pauline Bos does not want to go back, she was placed on metoprolol for prevention of migraines, she is also had Botox injections before, she still has migraines 1-2 a month and tension headaches about once a week usually stress related, she is taking Advil or Tylenol about 4 to 5 days/week.   Patient is here alone and she reports migraine started in college, they have gotten  more frequent, Fioricet does not really help, she is also tried ibuprofen, Tylenol, Excedrin Migraine, Excedrin extra strength, Aleve and none help.Both mother and aunt are seen her. Starts right periorbital, horrible and miserable and they hurt. As they progress they can spread. Pulsating/pouding/throbbing/photo/phonophobia, nausea, occ dizziness, movement makes them worse, last 48-72 ours and be severe. 26/30 headache  days and out of those, she will wake up with headaches, 8-10 migraine days a month that can be moderatelty-severe to severe. Unknown triggers. Her menses came make them worse or trigger them. Sleeping/dark room may help. OTC meds do no thelp she doesn't take them. She has never tried a triptan. She tried fioricet. She has mitral valve prolapse, fioricet hasn;t helped. Worse positionally supine in the morning when she wakes up with them. No other focal neurologic deficits, associated symptoms, inciting events or modifiable factors.   Reviewed notes, labs and imaging from outside physicians, which showed:   Medications tried that can be used in migraine management: lexapro, metoprolol, fioricet, excedrin, ibu,tylenol, mobic , medrol  dosepak, nerve blocks  REVIEW OF SYSTEMS: Out of a complete 14 system review of symptoms, the patient complains only of the following symptoms, and all other reviewed systems are negative.  ALLERGIES: No Known Allergies  HOME MEDICATIONS: Outpatient Medications Prior to Visit  Medication Sig Dispense Refill   escitalopram (LEXAPRO) 10 MG tablet Take 10 mg by mouth at bedtime.     fluconazole  (DIFLUCAN ) 150 MG tablet Take 1 tablet (150 mg total) by mouth every 3 (three) days. 2 tablet 0   Galcanezumab -gnlm (EMGALITY ) 120 MG/ML SOAJ Inject 120 mg into the skin every 30 (thirty) days. 1 mL 11   metoprolol succinate (TOPROL-XL) 50 MG 24 hr tablet Take 50 mg by mouth daily.     norethindrone  (MICRONOR ) 0.35 MG tablet Take 1 tablet (0.35 mg total) by mouth daily. 84 tablet 3   Rimegepant Sulfate (NURTEC) 75 MG TBDP Take 1 tablet at the onset of migraine. Only 1 tablet in 24 hours. 10 tablet 11   No facility-administered medications prior to visit.    PAST MEDICAL HISTORY: Past Medical History:  Diagnosis Date   Anxiety    COVID    History of PCOS    Migraine    MVP (mitral valve prolapse)     PAST SURGICAL HISTORY: Past Surgical History:  Procedure Laterality  Date   ANAL FISSURE REPAIR     in college   ELBOW SURGERY     reconstructive nerve surgery at 7 years ago.    FAMILY HISTORY: Family History  Problem Relation Age of Onset   Migraines Mother    Hypertension Father    Leukemia Maternal Aunt    Migraines Maternal Aunt    Thyroid disease Maternal Grandmother    Prostate cancer Paternal Grandfather    Heart disease Cousin     SOCIAL HISTORY: Social History   Socioeconomic History   Marital status: Married    Spouse name: Not on file   Number of children: 1   Years of education: Not on file   Highest education level: Bachelor's degree (e.g., BA, AB, BS)  Occupational History   Not on file  Tobacco Use   Smoking status: Never   Smokeless tobacco: Never  Vaping Use   Vaping status: Never Used  Substance and Sexual Activity   Alcohol  use: Not Currently   Drug use: Never   Sexual activity: Yes    Comment: Husband w/vasectomy/first intercourse>16, less than 5 partners  Other Topics  Concern   Not on file  Social History Narrative   Lives at home with husband and daughter   Right handed   Caffeine: 2 cups/day   Social Drivers of Corporate investment banker Strain: Not on file  Food Insecurity: Not on file  Transportation Needs: Not on file  Physical Activity: Not on file  Stress: Not on file  Social Connections: Not on file  Intimate Partner Violence: Not on file      PHYSICAL EXAM Generalized: Well developed, in no acute distress   Neurological examination  Mentation: Alert oriented to time, place, history taking. Follows all commands speech and language fluent Cranial nerve II-XII: Facial symmetry noted  DIAGNOSTIC DATA (LABS, IMAGING, TESTING) - I reviewed patient records, labs, notes, testing and imaging myself where available.  Lab Results  Component Value Date   WBC 12.1 (H) 07/06/2020   HGB 14.5 07/06/2020   HCT 41.9 07/06/2020   MCV 94 07/06/2020   PLT 381 07/06/2020      Component Value  Date/Time   NA 139 07/06/2020 0950   K 4.6 07/06/2020 0950   CL 102 07/06/2020 0950   CO2 23 07/06/2020 0950   GLUCOSE 83 07/06/2020 0950   BUN 18 07/06/2020 0950   CREATININE 0.84 07/06/2020 0950   CALCIUM 10.0 07/06/2020 0950   PROT 7.4 07/06/2020 0950   ALBUMIN 4.7 07/06/2020 0950   AST 18 07/06/2020 0950   ALT 23 07/06/2020 0950   ALKPHOS 140 (H) 07/06/2020 0950   BILITOT 0.3 07/06/2020 0950   GFRNONAA 91 07/06/2020 0950   GFRAA 105 07/06/2020 0950    Lab Results  Component Value Date   TSH 1.610 07/06/2020      ASSESSMENT AND PLAN 38 y.o. year old female  has a past medical history of Anxiety, COVID, History of PCOS, Migraine, and MVP (mitral valve prolapse). here with:   1.  Migraine headaches  Continue Emgality  monthly injections Advised if headache frequency or severity increases she should let us  know Follow-up in 1 year or sooner if needed   The patient's condition requires frequent monitoring and adjustments in the treatment plan, reflecting the ongoing complexity of care.  This provider is the continuing focal point for all needed services for this condition.  Clem Currier, MSN, NP-C 12/24/2023, 8:00 AM [facility] [address] [place], [place] [address] [phone]

## 2023-12-24 NOTE — Patient Instructions (Signed)
Your Plan:  Continue Emgality If your symptoms worsen or you develop new symptoms please let us know.   Thank you for coming to see [place] at [facility]. I hope we have been able to provide you high quality care today.  You may receive a patient satisfaction survey over the next few weeks. We would appreciate your feedback and comments so that we may continue to improve ourselves and the health of our patients.

## 2024-01-12 ENCOUNTER — Other Ambulatory Visit: Payer: Self-pay | Admitting: Nurse Practitioner

## 2024-01-12 DIAGNOSIS — E282 Polycystic ovarian syndrome: Secondary | ICD-10-CM

## 2024-01-12 DIAGNOSIS — N946 Dysmenorrhea, unspecified: Secondary | ICD-10-CM

## 2024-01-14 NOTE — Telephone Encounter (Signed)
 Med refill request:Micronor  0.35 mg tab po daily Last AEX: 02/07/23-TW Next AEX: MyChart message sent advising AEX needed.  Last MMG (if hormonal med) N/A  Refill authorized: Please Advise?

## 2024-01-20 ENCOUNTER — Ambulatory Visit: Attending: Cardiology | Admitting: Cardiology

## 2024-01-20 ENCOUNTER — Encounter: Payer: Self-pay | Admitting: Cardiology

## 2024-01-20 VITALS — BP 92/64 | HR 66 | Ht 66.5 in | Wt 213.6 lb

## 2024-01-20 DIAGNOSIS — E782 Mixed hyperlipidemia: Secondary | ICD-10-CM

## 2024-01-20 DIAGNOSIS — R002 Palpitations: Secondary | ICD-10-CM | POA: Diagnosis not present

## 2024-01-20 DIAGNOSIS — I341 Nonrheumatic mitral (valve) prolapse: Secondary | ICD-10-CM

## 2024-01-20 MED ORDER — METOPROLOL SUCCINATE ER 25 MG PO TB24: 25.0000 mg | ORAL_TABLET | Freq: Every day | ORAL | 3 refills | Status: AC

## 2024-01-20 NOTE — Patient Instructions (Signed)
 Medication Instructions:  DECREASE Metoprolol  to 25 mg daily  *If you need a refill on your cardiac medications before your next appointment, please call your pharmacy*  Testing/Procedures: ECHOCARDIOGRAM Your physician has requested that you have an echocardiogram. Echocardiography is a painless test that uses sound waves to create images of your heart. It provides your doctor with information about the size and shape of your heart and how well your heart's chambers and valves are working. This procedure takes approximately one hour. There are no restrictions for this procedure. Please do NOT wear cologne, perfume, aftershave, or lotions (deodorant is allowed). Please arrive 15 minutes prior to your appointment time.  Please note: We ask at that you not bring children with you during ultrasound (echo/ vascular) testing. Due to room size and safety concerns, children are not allowed in the ultrasound rooms during exams. Our front office staff cannot provide observation of children in our lobby area while testing is being conducted. An adult accompanying a patient to their appointment will only be allowed in the ultrasound room at the discretion of the ultrasound technician under special circumstances. We apologize for any inconvenience.   30 Day Heart Monitor .Your physician has recommended that you wear an event monitor. Event monitors are medical devices that record the heart's electrical activity. Doctors most often us  these monitors to diagnose arrhythmias. Arrhythmias are problems with the speed or rhythm of the heartbeat. The monitor is a small, portable device. You can wear one while you do your normal daily activities. This is usually used to diagnose what is causing palpitations/syncope (passing out).   CALCIUM SCORE CT SCAN Your physician has requested that you have a coronary calcium score performed. This is not covered by insurance and will be an out-of-pocket cost of approximately $99.    Follow-Up: At [facility], you and your health needs are our priority.  As part of our continuing mission to provide you with exceptional heart care, our providers are all part of one team.  This team includes your primary Cardiologist (physician) and Advanced Practice Providers or APPs (Physician Assistants and Nurse Practitioners) who all work together to provide you with the care you need, when you need it.  Your next appointment:   3 Months   Provider:   Newman JINNY Mauch, MD

## 2024-01-20 NOTE — Progress Notes (Signed)
 Cardiology Office Note:  .   Date:  01/20/2024  ID:  Sabrina Gray, DOB [birth year], MRN [id number] PCP: Katina Pfeiffer, PA-C  [facility] Providers Cardiologist:  Newman Walen, MD PCP: Katina Pfeiffer, PA-C  Chief Complaint  Patient presents with   Palpitations     Sabrina Gray is a 38 y.o. female with hyperlipidemia, mitral valve prolapse, palpitations  Discussed the use of AI scribe software for clinical note transcription with the patient, who gave verbal consent to proceed.  History of Present Illness Sabrina Gray is a 38 year old female with mitral valve prolapse who presents with frequent palpitations. She was referred by Dr. Alwyn for evaluation of her palpitations.  She experiences palpitations identified as premature atrial contractions (PACs), which have increased in frequency over the past three months, lasting two to three hours at a time. Initially sporadic, they became more frequent, leading to consultation with her primary care physician. She was taken off topiramate, but her symptoms remained unchanged. She has been on metoprolol  since high school for mitral valve prolapse. Her blood pressure has been steadily decreasing, but she continues taking metoprolol . Recently, the palpitations have been less frequent, with the last significant episode on June 23rd, captured on an EKG showing normal rhythm with PACs.  No chest pain, shortness of breath, or significant lightheadedness associated with the palpitations, although she occasionally experiences random lightheadedness. She does not consume alcohol , has limited caffeine intake, and primarily drinks water. She does not smoke.  Her family history is notable for a cousin on her mother's side who died unexpectedly from heart disease at the age of 26.      Vitals:   01/20/24 1412  BP: 92/64  Pulse: 66  SpO2: 99%      Review of Systems  Cardiovascular:  Positive  for palpitations. Negative for chest pain, dyspnea on exertion, leg swelling and syncope.        Studies Reviewed: [name]        EKG 01/20/2024: Normal sinus rhythm When compared with ECG of 04-Mar-2006 04:19, No significant change was found  Labs 04/2023: Chol 253, TG 292, HDL 44, LDL 154 HbA1C 5.4% Hb 13.6 Cr 0.8 TSH 1.7   Physical Exam Vitals and nursing note reviewed.  Constitutional:      General: She is not in acute distress. Neck:     Vascular: No JVD.  Cardiovascular:     Rate and Rhythm: Normal rate and regular rhythm.     Heart sounds: Normal heart sounds. No murmur heard. Pulmonary:     Effort: Pulmonary effort is normal.     Breath sounds: Normal breath sounds. No wheezing or rales.  Musculoskeletal:     Right lower leg: No edema.     Left lower leg: No edema.      VISIT DIAGNOSES:   ICD-10-CM   1. Palpitations  R00.2 EKG 12-Lead    CARDIAC EVENT MONITOR    2. Mixed hyperlipidemia  E78.2 CT CARDIAC SCORING (SELF PAY ONLY)    3. Mitral valve prolapse  I34.1 ECHOCARDIOGRAM COMPLETE       Sabrina Gray is a 38 y.o. female with  hyperlipidemia, mitral valve prolapse, palpitations Assessment and Plan Assessment & Plan Palpitations: Reported prior history of PACs and mitral prolapse. Recommend 30-day monitor. Given low blood pressures, reasonable to reduce metoprolol  succinate to 25 mg daily for now.   Ensure adequate hydration with 2-3 liters of water daily.  Mitral valve prolapse: Asymptomatic mitral  valve prolapse with no recent echocardiogram. - Order echocardiogram to assess mitral valve prolapse.  Mixed hyperlipidemia: Elevated cholesterol with family history of heart disease. Lifestyle changes implemented. - Check repeat cholesterol levels during scheduled blood work on July 31st with Dr. Antoinette. - Order calcium score scan for risk stratification. - Recommend heart-healthy diet and lifestyle changes, including walking 10,000 steps  most days, using olive oil for cooking, consuming more fish, fruits, whole grains, vegetables, nuts, lean meat, and less red meat, high saturated fat dairy, and processed foods.     Meds ordered this encounter  Medications   metoprolol  succinate (TOPROL -XL) 25 MG 24 hr tablet    Sig: Take 1 tablet (25 mg total) by mouth daily.    Dispense:  90 tablet    Refill:  3     F/u in 3 months  Signed, Newman JINNY Mccuen, MD

## 2024-02-03 ENCOUNTER — Ambulatory Visit (HOSPITAL_BASED_OUTPATIENT_CLINIC_OR_DEPARTMENT_OTHER)
Admission: RE | Admit: 2024-02-03 | Discharge: 2024-02-03 | Disposition: A | Discharge status: Home / Self Care | Payer: Self-pay | Source: Ambulatory Visit | Attending: Cardiology | Admitting: Cardiology

## 2024-02-03 DIAGNOSIS — E782 Mixed hyperlipidemia: Secondary | ICD-10-CM | POA: Insufficient documentation

## 2024-02-04 ENCOUNTER — Ambulatory Visit: Payer: Self-pay | Admitting: Cardiology

## 2024-02-11 ENCOUNTER — Ambulatory Visit (HOSPITAL_COMMUNITY)
Admission: RE | Admit: 2024-02-11 | Discharge: 2024-02-11 | Disposition: A | Discharge status: Home / Self Care | Source: Ambulatory Visit | Attending: Cardiology | Admitting: Cardiology

## 2024-02-11 DIAGNOSIS — I341 Nonrheumatic mitral (valve) prolapse: Secondary | ICD-10-CM | POA: Insufficient documentation

## 2024-02-11 LAB — ECHOCARDIOGRAM COMPLETE
AR max vel: 2.24 cm2
AV Area VTI: 2.16 cm2
AV Area mean vel: 2.04 cm2
AV Mean grad: 4 mmHg
AV Peak grad: 6.6 mmHg
Ao pk vel: 1.28 m/s
S' Lateral: 2.98 cm

## 2024-02-18 ENCOUNTER — Ambulatory Visit: Payer: Self-pay | Admitting: Cardiology

## 2024-03-09 ENCOUNTER — Ambulatory Visit: Attending: Cardiology

## 2024-03-09 DIAGNOSIS — R002 Palpitations: Secondary | ICD-10-CM

## 2024-03-13 DIAGNOSIS — R002 Palpitations: Secondary | ICD-10-CM

## 2024-04-09 ENCOUNTER — Other Ambulatory Visit: Payer: Self-pay | Admitting: Nurse Practitioner

## 2024-04-09 DIAGNOSIS — E282 Polycystic ovarian syndrome: Secondary | ICD-10-CM

## 2024-04-09 DIAGNOSIS — N946 Dysmenorrhea, unspecified: Secondary | ICD-10-CM

## 2024-04-09 NOTE — Telephone Encounter (Signed)
 Med refill request: MICRONOR   Last AEX: 02/07/23  Next AEX: 05/11/24 Last MMG (if hormonal med) Refill authorized: last rx 01/15/24 #[id number] with 0 refills. Please approve or deny

## 2024-04-17 ENCOUNTER — Encounter: Payer: Self-pay | Admitting: Cardiology

## 2024-04-17 ENCOUNTER — Ambulatory Visit: Attending: Cardiology | Admitting: Cardiology

## 2024-04-17 VITALS — BP 128/70 | HR 69 | Resp 17 | Ht 66.0 in | Wt 203.0 lb

## 2024-04-17 DIAGNOSIS — E782 Mixed hyperlipidemia: Secondary | ICD-10-CM | POA: Insufficient documentation

## 2024-04-17 NOTE — Patient Instructions (Signed)
 Follow-Up: At [facility], you and your health needs are our priority.  As part of our continuing mission to provide you with exceptional heart care, our providers are all part of one team.  This team includes your primary Cardiologist (physician) and Advanced Practice Providers or APPs (Physician Assistants and Nurse Practitioners) who all work together to provide you with the care you need, when you need it.  Your next appointment:   As needed  Provider:   Cody Das, MD

## 2024-04-17 NOTE — Progress Notes (Signed)
  Cardiology Office Note:  .   Date:  04/17/2024  ID:  Sabrina Gray, DOB 07/31/1985, MRN [id number] PCP: Sabrina Pfeiffer, PA-C  [facility] Providers Cardiologist:  Sabrina Virgo, MD PCP: Sabrina Pfeiffer, PA-C  Chief Complaint  Patient presents with   Palpitations   Follow-up    3 month     Sabrina Gray is a 38 y.o. female with hyperlipidemia, mitral valve prolapse, palpitations   History of Present Illness Patient is doing well.  She has not had any recent chest pain, shortness of, palpitation symptoms.  Reviewed recent test results with the patient, details below.      Vitals:   04/17/24 0759  BP: 128/70  Pulse: 69  Resp: 17  SpO2: 99%      Review of Systems  Cardiovascular:  Negative for chest pain, dyspnea on exertion, leg swelling, palpitations and syncope.        Studies Reviewed: [name]        EKG 01/20/2024: Normal sinus rhythm When compared with ECG of 04-Mar-2006 04:19, No significant change was found  Calcium score 01/2024: 0  Mobile cardiac telemetry 25 days 02/12/2024 - 03/07/2024: Dominant rhythm: Sinus. HR 42-123 bpm. Avg HR 66 bpm. No atrial fibrillation/atrial flutter/SVT/VT/high grade AV block, sinus pause >3sec noted. <1% SVE/VE.   Echocardiogram 01/2024:  1. Left ventricular ejection fraction, by estimation, is 60 to 65%. The  left ventricle has normal function. The left ventricle has no regional  wall motion abnormalities. Left ventricular diastolic parameters were  normal.   2. Right ventricular systolic function is normal. The right ventricular  size is normal.   3. The mitral valve is normal in structure. No evidence of mitral valve  regurgitation. No evidence of mitral stenosis.   4. The aortic valve is normal in structure. Aortic valve regurgitation is  not visualized. No aortic stenosis is present.   5. The inferior vena cava is normal in size with greater than 50%  respiratory variability,  suggesting right atrial pressure of 3 mmHg.    Labs 04/2023: Chol 253, TG 292, HDL 44, LDL 154 HbA1C 5.4% Hb 13.6 Cr 0.8 TSH 1.7   Physical Exam Vitals and nursing note reviewed.  Constitutional:      General: She is not in acute distress. Neck:     Vascular: No JVD.      VISIT DIAGNOSES:   ICD-10-CM   1. Mixed hyperlipidemia  E78.2         Sabrina Gray is a 38 y.o. female with  hyperlipidemia, mitral valve prolapse, palpitations Assessment and Plan Assessment & Plan Palpitations: No arrhythmia noted on recent heart monitor.  Mitral valve prolapse: No clear mitral prolapse seen on recent echocardiogram, no mitral regurgitation.  Mixed hyperlipidemia: LDL 154.  Calcium score 0.  Not on statin.  Recommend heartedly diet and lifestyle.  Consider repeating calcium score scan in next 5 to 10 years.       F/u as needed  Signed, Sabrina JINNY Venezia, MD

## 2024-05-11 ENCOUNTER — Encounter: Payer: Self-pay | Admitting: Nurse Practitioner

## 2024-05-11 ENCOUNTER — Ambulatory Visit: Admitting: Nurse Practitioner

## 2024-05-11 ENCOUNTER — Other Ambulatory Visit (HOSPITAL_COMMUNITY)
Admission: RE | Admit: 2024-05-11 | Discharge: 2024-05-11 | Disposition: A | Discharge status: Home / Self Care | Source: Ambulatory Visit | Attending: Nurse Practitioner | Admitting: Nurse Practitioner

## 2024-05-11 VITALS — BP 116/72 | HR 68 | Ht 66.0 in | Wt 204.0 lb

## 2024-05-11 DIAGNOSIS — Z124 Encounter for screening for malignant neoplasm of cervix: Secondary | ICD-10-CM | POA: Diagnosis present

## 2024-05-11 DIAGNOSIS — Z01419 Encounter for gynecological examination (general) (routine) without abnormal findings: Secondary | ICD-10-CM | POA: Insufficient documentation

## 2024-05-11 DIAGNOSIS — N946 Dysmenorrhea, unspecified: Secondary | ICD-10-CM | POA: Diagnosis not present

## 2024-05-11 DIAGNOSIS — E282 Polycystic ovarian syndrome: Secondary | ICD-10-CM

## 2024-05-11 DIAGNOSIS — Z1331 Encounter for screening for depression: Secondary | ICD-10-CM

## 2024-05-11 MED ORDER — NORETHINDRONE 0.35 MG PO TABS: 1.0000 | ORAL_TABLET | Freq: Every day | ORAL | 3 refills | Status: AC

## 2024-05-11 NOTE — Progress Notes (Signed)
   Sabrina Gray August 26, 1985 [id number]   History:  38 y.o. G2P0011 presents for annual exam. Monthly cycles. POPs. Experienced daily headaches with Nuvaring. H/O PCOS, migraines without aura. Sees cardiology for MVP.   Gynecologic History Patient's last menstrual period was 04/30/2024 (exact date). Period Duration (Days): 4 Period Pattern: Regular Menstrual Flow: Moderate Menstrual Control: Maxi pad, Tampon Dysmenorrhea: (!) Moderate Dysmenorrhea Symptoms: Cramping Contraception/Family planning: oral progesterone-only contraceptive and vasectomy Sexually active: Yes  Health Maintenance Last Pap: 03/09/2021. Results were: Normal Last mammogram: Not indicated Last colonoscopy: Not indicated Last Dexa: Not indicated      05/11/2024    3:31 PM  Depression screen PHQ 2/9  Decreased Interest 0  Down, Depressed, Hopeless 0  PHQ - 2 Score 0     Past medical history, past surgical history, family history and social history were all reviewed and documented in the EPIC chart. Married. Works in [employer] for [employer]. 38 yo daughter.   ROS:  A ROS was performed and pertinent positives and negatives are included.  Exam:  Vitals:   05/11/24 1527  BP: 116/72  Pulse: 68  SpO2: 97%  Weight: 204 lb (92.5 kg)  Height: 5' 6 (1.676 m)    Body mass index is 32.93 kg/m.  General appearance:  Normal Thyroid:  Symmetrical, normal in size, without palpable masses or nodularity. Respiratory  Auscultation:  Clear without wheezing or rhonchi Cardiovascular  Auscultation:  Regular rate, without rubs, murmurs or gallops  Edema/varicosities:  Not grossly evident Abdominal  Soft,nontender, without masses, guarding or rebound.  Liver/spleen:  No organomegaly noted  Hernia:  None appreciated  Skin  Inspection:  Grossly normal Breasts: Examined lying and sitting.   Right: Without masses, retractions, nipple discharge or axillary adenopathy.   Left: Without masses,  retractions, nipple discharge or axillary adenopathy. Pelvic: External genitalia:  no lesions              Urethra:  normal appearing urethra with no masses, tenderness or lesions              Bartholins and Skenes: normal                 Vagina: normal appearing vagina with normal color and discharge, no lesions              Cervix: no lesions Bimanual Exam:  Uterus:  no masses or tenderness              Adnexa: no mass, fullness, tenderness              Rectovaginal: Deferred              Anus:  normal, no lesions  [name], CMA present as chaperone.   Assessment/Plan:  38 y.o. G2P0011 for annual exam.   Well female exam with routine gynecological exam - Education provided on SBEs, importance of preventative screenings, current guidelines, high calcium diet, regular exercise, and multivitamin daily. Labs with PCP.   PCOS (polycystic ovarian syndrome) - Plan: norethindrone  (MICRONOR ) 0.35 MG tablet daily. Doing well on this. Refill x 1 year provided.   Dysmenorrhea - Plan: norethindrone  (MICRONOR ) 0.35 MG tablet daily. Good management.   Cervical cancer screening - Plan: Cytology - PAP( [place]). Normal Pap history.  Pap today per guidelines.  Return in about 1 year (around 05/11/2025) for Annual.     [name] DNP, 3:47 PM 05/11/2024

## 2024-05-12 ENCOUNTER — Ambulatory Visit: Payer: Self-pay | Admitting: Nurse Practitioner

## 2024-05-12 LAB — CYTOLOGY - PAP
Adequacy: ABSENT
Comment: NEGATIVE
Diagnosis: NEGATIVE
High risk HPV: NEGATIVE

## 2024-12-22 ENCOUNTER — Telehealth: Admitting: Adult Health
